# Patient Record
Sex: Male | Born: 1955 | Race: White | Hispanic: No | Marital: Single | Smoking: Never smoker
Health system: Southern US, Community
[De-identification: ages and names within clinical notes are randomized; demographics above are authoritative.]

## PROBLEM LIST (undated history)

## (undated) DIAGNOSIS — I10 Essential (primary) hypertension: Secondary | ICD-10-CM

## (undated) DIAGNOSIS — E785 Hyperlipidemia, unspecified: Secondary | ICD-10-CM

## (undated) DIAGNOSIS — G300 Alzheimer's disease with early onset: Secondary | ICD-10-CM

## (undated) DIAGNOSIS — E538 Deficiency of other specified B group vitamins: Secondary | ICD-10-CM

## (undated) DIAGNOSIS — F0280 Dementia in other diseases classified elsewhere without behavioral disturbance: Secondary | ICD-10-CM

---

## 2015-04-28 ENCOUNTER — Other Ambulatory Visit: Payer: Self-pay | Admitting: Internal Medicine

## 2015-04-28 DIAGNOSIS — F0391 Unspecified dementia with behavioral disturbance: Secondary | ICD-10-CM

## 2015-05-19 ENCOUNTER — Other Ambulatory Visit: Payer: Self-pay

## 2015-06-09 ENCOUNTER — Ambulatory Visit
Admission: RE | Admit: 2015-06-09 | Discharge: 2015-06-09 | Disposition: A | Discharge status: Home / Self Care | Payer: BLUE CROSS/BLUE SHIELD | Source: Ambulatory Visit | Attending: Internal Medicine | Admitting: Internal Medicine

## 2015-06-09 DIAGNOSIS — R413 Other amnesia: Secondary | ICD-10-CM | POA: Diagnosis not present

## 2015-06-09 DIAGNOSIS — F0391 Unspecified dementia with behavioral disturbance: Secondary | ICD-10-CM | POA: Insufficient documentation

## 2015-06-09 MED ORDER — GADOBENATE DIMEGLUMINE 529 MG/ML IV SOLN
20.0000 mL | Freq: Once | INTRAVENOUS | Status: AC | PRN
  Administered 2015-06-09: 18 mL via INTRAVENOUS

## 2017-02-20 ENCOUNTER — Emergency Department
Admission: EM | Admit: 2017-02-20 | Discharge: 2017-02-20 | Disposition: A | Discharge status: Home / Self Care | Payer: Medicare HMO | Attending: Emergency Medicine | Admitting: Emergency Medicine

## 2017-02-20 ENCOUNTER — Emergency Department: Payer: Medicare HMO

## 2017-02-20 DIAGNOSIS — I10 Essential (primary) hypertension: Secondary | ICD-10-CM | POA: Insufficient documentation

## 2017-02-20 DIAGNOSIS — F0281 Dementia in other diseases classified elsewhere with behavioral disturbance: Secondary | ICD-10-CM

## 2017-02-20 DIAGNOSIS — G300 Alzheimer's disease with early onset: Secondary | ICD-10-CM | POA: Diagnosis not present

## 2017-02-20 DIAGNOSIS — Z79899 Other long term (current) drug therapy: Secondary | ICD-10-CM | POA: Diagnosis not present

## 2017-02-20 DIAGNOSIS — F0390 Unspecified dementia without behavioral disturbance: Secondary | ICD-10-CM

## 2017-02-20 DIAGNOSIS — Z7982 Long term (current) use of aspirin: Secondary | ICD-10-CM | POA: Diagnosis not present

## 2017-02-20 DIAGNOSIS — F02818 Dementia in other diseases classified elsewhere, unspecified severity, with other behavioral disturbance: Secondary | ICD-10-CM

## 2017-02-20 LAB — CBC
HEMATOCRIT: 43.9 % (ref 40.0–52.0)
HEMOGLOBIN: 15.5 g/dL (ref 13.0–18.0)
MCH: 31.9 pg (ref 26.0–34.0)
MCHC: 35.3 g/dL (ref 32.0–36.0)
MCV: 90.3 fL (ref 80.0–100.0)
Platelets: 185 10*3/uL (ref 150–440)
RBC: 4.86 MIL/uL (ref 4.40–5.90)
RDW: 13.2 % (ref 11.5–14.5)
WBC: 7.8 10*3/uL (ref 3.8–10.6)

## 2017-02-20 LAB — COMPREHENSIVE METABOLIC PANEL
ALT: 18 U/L (ref 17–63)
AST: 26 U/L (ref 15–41)
Albumin: 4.8 g/dL (ref 3.5–5.0)
Alkaline Phosphatase: 56 U/L (ref 38–126)
Anion gap: 11 (ref 5–15)
BUN: 13 mg/dL (ref 6–20)
CO2: 24 mmol/L (ref 22–32)
Calcium: 9.3 mg/dL (ref 8.9–10.3)
Chloride: 104 mmol/L (ref 101–111)
Creatinine, Ser: 1.15 mg/dL (ref 0.61–1.24)
Glucose, Bld: 145 mg/dL — ABNORMAL HIGH (ref 65–99)
POTASSIUM: 4.3 mmol/L (ref 3.5–5.1)
Sodium: 139 mmol/L (ref 135–145)
Total Bilirubin: 1.1 mg/dL (ref 0.3–1.2)
Total Protein: 7.2 g/dL (ref 6.5–8.1)

## 2017-02-20 MED ORDER — HALOPERIDOL 0.5 MG PO TABS: 0.5000 mg | ORAL_TABLET | Freq: Three times a day (TID) | ORAL | 0 refills | Status: DC

## 2017-02-20 MED ORDER — HALOPERIDOL 0.5 MG PO TABS
0.5000 mg | ORAL_TABLET | Freq: Once | ORAL | Status: AC
  Administered 2017-02-20: 0.5 mg via ORAL
  Filled 2017-02-20: qty 1

## 2017-02-20 NOTE — ED Notes (Signed)
RN called [facility] to give report. RN reported that [employer] worker had given report. This RN asked if there were any further questions [facility] denied having questions and reported to send pt back via EMS.

## 2017-02-20 NOTE — ED Notes (Signed)
BEHAVIORAL HEALTH ROUNDING Patient sleeping: No. Patient alert and oriented:  Alert - oriented to self  Behavior appropriate: Yes.  ; If no, describe:  Nutrition and fluids offered: yes Toileting and hygiene offered: Yes  Sitter present: q15 minute observations and security monitoring Law enforcement present: Yes  ODS   ENVIRONMENTAL ASSESSMENT Potentially harmful objects out of patient reach: Yes.   Personal belongings secured: Yes.   Patient dressed in hospital provided attire only: Yes.   Plastic bags out of patient reach: Yes.   Patient care equipment (cords, cables, call bells, lines, and drains) shortened, removed, or accounted for: Yes.   Equipment and supplies removed from bottom of stretcher: Yes.   Potentially toxic materials out of patient reach: Yes.   Sharps container removed or out of patient reach: Yes.

## 2017-02-20 NOTE — ED Provider Notes (Signed)
-----------------------------------------   6:52 PM on 02/20/2017 -----------------------------------------   Blood pressure (!) 139/6, pulse 72, temperature 98.3 F (36.8 C), temperature source Oral, resp. rate 18, height  (1.727 m), weight 63.5 kg (140 lb), SpO2 96 %.  Patient has been evaluated by psychiatry and cleared for discharge. IVC lifted by Dr. Toni Amend. Patient's labs have been reviewed with no acute findings. Patient will be discharged at this time to [facility], [place], [place] 02/20/17 [phone]

## 2017-02-20 NOTE — ED Notes (Signed)
EMS has arrived to take pt home. Pt in stretcher and in NAD at this time.

## 2017-02-20 NOTE — Clinical Social Work Note (Addendum)
CSW received Social Work consult. CSW spoke with [place] at [facility] [phone] who stated pt has been a resident for 1 1/2 years and has not exhibited the physical and verbal aggressive behaviors that he has exhibited in the last 2 weeks. Burna Mortimer states pt was taken off of 0.5 mg of scheduled Haldol and switched to PRN Haldol and "this is when the behaviors began." [place] states facility is agreeable to accept pt back if on scheduled Haldol.   CSW updated Dr. Weber Cooks, who is agreeable to provide Rx. CSW updated Dr. Alfred Levins. CSW met with pt at bedside and pt agreeable and anxious to return back "home"  (facility). RN to call report to [place] at [phone]. ED Secretary to arrange transportation with EMS. CSW signing off as no further Social Work needs identified.   Oretha Ellis, Latanya Presser, [place] Social Worker-ED [phone]

## 2017-02-20 NOTE — ED Provider Notes (Signed)
[facility] Emergency Department Provider Note  ____________________________________________  Time seen: Approximately 3:02 PM  I have reviewed the triage vital signs and the nursing notes.   HISTORY  Chief Complaint Dementia  Level 5 Caveat: Portions of the History and Physical are unable to be obtained due to patient being a poor historian related to chronic dementia   HPI Miguel Tran is a 61 y.o. male sent to the ED from [facility] due to trying to walk out of the facility today. Patient has a history of dementia, on Aricept and Namenda, but is not currently in the memory care unit at [facility]. No falls or acute symptoms reported. Patient denies any complaints whatsoever.  patient denies chest pain abdominal pain headache vision changes or weakness or shortness of breath   No past medical history on file. dementia Hypertension  There are no active problems to display for this patient.    No past surgical history on file.   Prior to Admission medications   Medication Sig Start Date End Date Taking? Authorizing Provider  alum & mag hydroxide-simeth (MINTOX) 200-200-20 MG/5ML suspension Take 30 mLs by mouth as needed for indigestion or heartburn.   Yes [provider]  amLODipine (NORVASC) 10 MG tablet Take 10 mg by mouth daily.   Yes [provider]  aspirin EC 81 MG tablet Take 81 mg by mouth daily.   Yes [provider]  donepezil (ARICEPT) 10 MG tablet Take 10 mg by mouth at bedtime. 09/02/15  Yes [provider]  guaifenesin (ROBITUSSIN) 100 MG/5ML syrup Take 200 mg by mouth every 6 (six) hours as needed for cough.   Yes [provider]  haloperidol (HALDOL) 0.5 MG tablet Take 0.5 mg by mouth every 6 (six) hours as needed for agitation.   Yes [provider]  loperamide (IMODIUM) 2 MG capsule Take 2 mg by mouth as needed for diarrhea or loose stools.   Yes [provider]   LORazepam (ATIVAN) 1 MG tablet Take 1 mg by mouth 3 (three) times daily.   Yes [provider]  magnesium hydroxide (MILK OF MAGNESIA) 400 MG/5ML suspension Take 30 mLs by mouth at bedtime as needed for constipation.   Yes [provider]  memantine (NAMENDA) 5 MG tablet Take 5 mg by mouth 2 (two) times daily.   Yes [provider]  Neomycin-Bacitracin-Polymyxin (TRIPLE ANTIBIOTIC) [phone] OINT Apply 1 application topically as needed.   Yes [provider]  QUEtiapine (SEROQUEL) 100 MG tablet Take 100 mg by mouth at bedtime.   Yes [provider]  sertraline (ZOLOFT) 50 MG tablet Take 50 mg by mouth daily.   Yes [provider]  vitamin B-12 (CYANOCOBALAMIN) 1000 MCG tablet Take 1,000 mcg by mouth daily.   Yes [provider]     Allergies Patient has no known allergies.   No family history on file.  Social History Social History  Substance Use Topics  . Smoking status: Never Smoker  . Smokeless tobacco: Never Used  . Alcohol use No    Review of Systems unable to reliably obtain due to dementia  ____________________________________________   PHYSICAL EXAM:  VITAL SIGNS: ED Triage Vitals  Enc Vitals Group     BP 02/20/17 1409 (!) 139/6     Pulse Rate 02/20/17 1409 72     Resp 02/20/17 1409 18     Temp 02/20/17 1409 98.3 F (36.8 C)     Temp Source 02/20/17 1409 Oral  SpO2 02/20/17 1409 96 %     Weight 02/20/17 1335 140 lb (63.5 kg)     Height 02/20/17 1335  (1.727 m)     Head Circumference --      Peak Flow --      Pain Score --      Pain Loc --      Pain Edu? --      Excl. in GC? --     Vital signs reviewed, nursing assessments reviewed.   Constitutional:   Alert and oriented to self. Well appearing and in no distress. Eyes:   No scleral icterus.  EOMI. No nystagmus. No conjunctival pallor. PERRL. ENT   Head:   Normocephalic and atraumatic.   Nose:   No congestion/rhinnorhea.     Mouth/Throat:   MMM, no pharyngeal erythema. No peritonsillar mass.    Neck:   No meningismus. Full ROM Hematological/Lymphatic/Immunilogical:   No cervical lymphadenopathy. Cardiovascular:   RRR. Symmetric bilateral radial and DP pulses.  No murmurs.  Respiratory:   Normal respiratory effort without tachypnea/retractions. Breath sounds are clear and equal bilaterally. No wheezes/rales/rhonchi. Gastrointestinal:   Soft and nontender. Non distended. There is no CVA tenderness.  No rebound, rigidity, or guarding. Genitourinary:   deferred Musculoskeletal:   Normal range of motion in all extremities. No joint effusions.  No lower extremity tenderness.  No edema. Neurologic:   speech likely at baseline. Some difficulty expressing complete sentences and thoughts. Stuttering..  Motor grossly intact.ambulatory with steady gait No acute focal neurologic deficits are appreciated.  Skin:    Skin is warm, dry and intact. No rash noted.  No petechiae, purpura, or bullae.  ____________________________________________    LABS (pertinent positives/negatives) (all labs ordered are listed, but only abnormal results are displayed) Labs Reviewed  COMPREHENSIVE METABOLIC PANEL - Abnormal; Notable for the following:       Result Value   Glucose, Bld 145 (*)    All other components within normal limits  CBC  URINALYSIS, COMPLETE (UACMP) WITH MICROSCOPIC   ____________________________________________   EKG    ____________________________________________    RADIOLOGY  No results found.  ____________________________________________   PROCEDURES Procedures  ____________________________________________   INITIAL IMPRESSION / ASSESSMENT AND PLAN / ED COURSE  Pertinent labs & imaging results that were available during my care of the patient were reviewed by me and considered in my medical decision making (see chart for details).  patient well appearing no acute distress,  unremarkable vital signs. Sent to the ED for wandering behavior, likely part of his chronic dementia. Exam is otherwise benign and reassuring. Check labs, check CT head as I'm not able to get a clear picture of his baseline mental status from reviewing his outpatient primary care notes.  Requested TTS and psychiatry evaluation for further recommendations. I would expect the patient is suitable for discharge home, consideration of upgrading him to the memory care.      ____________________________________________   FINAL CLINICAL IMPRESSION(S) / ED DIAGNOSES  Final diagnoses:  Chronic dementia without behavioral disturbance      New Prescriptions   No medications on file     Portions of this note were generated with dragon dictation software. Dictation errors may occur despite best attempts at proofreading.    Sharman Cheek, MD 02/20/17 [phone]

## 2017-02-20 NOTE — BH Assessment (Signed)
Assessment Note  Miguel Tran is an 61 y.o. male presenting via EMS to [facility] for assessment. Information obtained from pt's residence [facility], [place],  [phone]), Pts father Miguel Tran [phone]), and pt interview. Pt has h/o dementia and is a poor historian. Pt is oriented to place only. Pt unable to provide current date and president. Pt responds to the name Miguel Tran and not Miguel Tran. Pt presents as increasingly irritable. Pt partially complies with clinician's request. Pt initially states he presents because "it's a prank". When asked for prank details, pt presents confused and denies any pranks. Pt denies SI and HI. Pt denies any type of altercations at home pta.  Per obtained collateral, pt has h/o dementia. Pt resides in [employer] assistant living unit and not on the memory care unit. Pt has become increasingly combative, agitated and verbally abusive toward staff. Pt also attempted to wander away from the assistant living on today. Staff reports onset of sxs to be two weeks ago. Pt's Haldol was was changed to PRN two weeks ago as well. Staff believe pt is in need of medication adjustment.  Miguel Tran contacted pt's father with behavioral concerns who in turn left a message with Miguel Tran (Miguel Tran). Miguel Tran recommended pt present to ED for evaluation.    Past Medical History: No past medical history on file.  No past surgical history on file.  Family History: No family history on file.  Social History:  reports that he has never smoked. He has never used smokeless tobacco. He reports that he does not drink alcohol. His drug history is not on file.  Additional Social History:     CIWA: CIWA-Ar BP: (!) 139/6 Pulse Rate: 72 COWS:    Allergies: No Known Allergies  Home Medications:  (Not in a hospital admission)  OB/GYN Status:  No LMP for male patient.  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) Can pt return to current living arrangement?: Yes Admission Status: Voluntary Is patient capable of signing voluntary admission?: No (altered mental status) Referral Source: [place] Insurance type: BCBS     Crisis Care Plan 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 :  (UTA due to altered mental status- pt poor historian) Suicidal Intent: No Has patient had any suicidal intent within the past 6 months prior to admission? : Other (comment) (UTA due to altered mental status- pt poor historian) Is patient at risk for suicide?: No Suicidal Plan?: No Has patient had any suicidal plan within the past 6 months prior to admission? :  (UTA due to altered mental status- pt poor historian) Access to Means: No What has been your use of drugs/alcohol within the last 12 months?: UTA due to altered mental status- pt poor historian Previous Attempts/Gestures:  (UTA due to altered mental status- pt poor historian) How many times?:  (UTA due to altered mental  status- pt poor historian) Other Self Harm Risks:  (UTA due to altered mental status- pt poor historian) Triggers for Past Attempts:  (UTA due to altered mental status- pt poor historian) Intentional Self Injurious Behavior:  (UTA due to altered mental status- pt poor historian) Family Suicide History:   (UTA due to altered mental status- pt poor historian) Recent stressful life event(s):  (UTA due to altered mental status- pt poor historian) Persecutory voices/beliefs?:  (UTA due to altered mental status- pt poor historian) Depression:  (UTA due to altered mental status- pt poor historian) Depression Symptoms:  (UTA due to altered mental status- pt poor historian) Substance abuse history and/or treatment for substance abuse?:  (UTA due to altered mental status- pt poor historian) Suicide prevention information given to non-admitted patients: Not applicable  Risk to Others within the past 6 months Homicidal Ideation: No Does patient have any lifetime risk of violence toward others beyond the six months prior to admission? :  (UTA due to altered mental status- pt poor historian) Thoughts of Harm to Others:  (UTA due to altered mental status- pt poor historian) Current Homicidal Intent:  (UTA due to altered mental status- pt poor historian) Current Homicidal Plan:  (UTA due to altered mental status- pt poor historian) Access to Homicidal Means:  (UTA due to altered mental status- pt poor historian) Identified Victim:  (UTA due to altered mental status- pt poor historian) History of harm to others?:  (UTA due to altered mental status- pt poor historian) Assessment of Violence: None Noted Does patient have access to weapons?:  (UTA due to altered mental status- pt poor historian) Criminal Charges Pending?:  (UTA due to altered mental status- pt poor historian) Does patient have a court date:  (UTA due to altered mental status- pt poor historian) Is patient on probation?:  (UTA due to altered mental status- pt poor historian)  Psychosis Hallucinations:  (UTA due to altered mental status- pt poor historian) Delusions:  (UTA due to altered mental status- pt poor historian)  Mental Status Report Appearance/Hygiene: Unremarkable Eye Contact: Fair Motor Activity: Freedom of movement Speech:  Aggressive Level of Consciousness: Irritable Mood: Irritable Affect: Other (Comment) (Mood Congruent) Anxiety Level: Moderate Thought Processes: Irrelevant Judgement: Impaired Orientation: Place Obsessive Compulsive Thoughts/Behaviors: None     ADLScreening [facility]) Patient's cognitive ability adequate to safely complete daily activities?: No Patient able to express need for assistance with ADLs?: Yes Independently performs ADLs?: Yes (appropriate for developmental age)  Prior Inpatient Therapy Prior Inpatient Therapy:  (UTA due to altered mental status- pt poor historian)  Prior Outpatient Therapy Prior Outpatient Therapy:  (UTA due to altered mental status- pt poor historian) Does patient have an ACCT team?: No Does patient have Intensive In-Tran Services?  : No Does patient have [facility]arch services? : No Does patient have P4CC services?: No  ADL Screening (condition at time of admission) Patient's cognitive ability adequate to safely complete daily activities?: No Is the patient deaf or have difficulty hearing?: No Does the patient have difficulty seeing, even when wearing glasses/contacts?: No Does the patient have difficulty concentrating, remembering, or making decisions?: Yes Patient able to express need for assistance with ADLs?: Yes Does the patient have difficulty dressing or bathing?: No Independently performs ADLs?: Yes (appropriate for developmental age) Does the patient have difficulty walking or climbing stairs?: No Weakness of Legs: None Weakness of Arms/Hands: None  Home Assistive Devices/Equipment Home Assistive Devices/Equipment: None  Therapy Consults (therapy consults require a physician order) PT Evaluation  Needed: No OT Evalulation Needed: No SLP Evaluation Needed: No Abuse/Neglect Assessment (Assessment to be complete while patient is alone) Physical Abuse:  (UTA due to altered mental status- pt poor historian) Verbal Abuse:  (UTA  due to altered mental status- pt poor historian) Sexual Abuse:  (UTA due to altered mental status- pt poor historian) Exploitation of patient/patient's resources:  (UTA due to altered mental status- pt poor historian) Self-Neglect:  (UTA due to altered mental status- pt poor historian) Values / Beliefs Cultural Requests During Hospitalization: None Spiritual Requests During Hospitalization: None Consults Spiritual Care Consult Needed: No Social Work Consult Needed: No Merchant navy officer (For Healthcare) Does Patient Have a Medical Advance Directive?: Yes Type of Advance Directive: Midwife Copy of Healthcare Power of Attorney in Chart?: No - copy requested (Clinician contacted pt's father Devarious Pavek [phone]) and requested copy be provided to hospital.) Would patient like information on creating a medical advance directive?: No - Patient declined    Additional Information 1:1 In Past 12 Months?: No CIRT Risk: No Elopement Risk: Yes Does patient have medical clearance?: No     Disposition:  Disposition Initial Assessment Completed for this Encounter: Yes Disposition of Patient: Other dispositions Other disposition(s): Other (Comment) (Pt recommended for d/c per Dr.Clapacs)  On Site Evaluation by:   Reviewed with Physician:    Elbie Statzer J [place] 02/20/2017 5:49 PM

## 2017-02-20 NOTE — ED Notes (Signed)
BEHAVIORAL HEALTH ROUNDING Patient sleeping: No. Patient alert : yes Behavior appropriate: Yes.  ; If no, describe:  Nutrition and fluids offered: yes Toileting and hygiene offered: Yes  Sitter present: q15 minute observations and security monitoring Law enforcement present: Yes  ODS  

## 2017-02-20 NOTE — ED Triage Notes (Signed)
He arrives today via [facility] from [facility] called EMS due to pt has been trying to walk away from the facility today  History of dementia

## 2017-02-20 NOTE — Consult Note (Signed)
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 male patient admitted with "sir I do not understand".  HPI:  Patient interviewed chart reviewed. Case reviewed with TTS and emergency room physician. 61 year old man has a history of early onset Alzheimer's disease which has been progressing for the past 2-3 years but has rapidly escalated. He was brought here from [place] with reports that he was trying to wander away from the facility. I don't have a lot of other detail. The patient tells me that he lives at "some kind of facility" and that he was going to visit his father. He is not able to tell a more clear story than that. He does not know where he is currently and does not understand his present situation. He has not been violent here but has been requiring a lot of redirection to keep him from wandering out of his room. Patient denies any physical symptoms. Denies feeling sick denies any pain. Denies feeling depressed. Denies suicidal or homicidal ideation. Does not appear to be hallucinating. Medications reviewed he is currently on low dose of Zoloft and Aricept and a fairly modest dose of Seroquel at night with Haldol being used when necessary. I am told by the TTS assessor that the people at [facility] say that he has gotten worse since his medicine of Haldol was changed from standing to when necessary.  Social history: Patient's  father is his power of attorney. Patient is living at [facility]. He is not currently married. Sounds like children are not really involved. He is a retired Hotel manager.  Medical history: No history of stroke or heart attack. Seems to be in relatively good health overall other than his dementia.  Substance abuse history: Nothing in the chart about any history of alcohol or drug abuse and the patient denies ever having had a problem with it  Past Psychiatric History: No psychiatric hospitalization no history of violence no history of suicide attempts. Currently on some psychiatric medicine to assist with behavior problems from his dementia. Patient has had a full workup for his dementia without any reversible cause being found and has been assumed to have Alzheimer's type dementia.  Risk to Self: Suicidal Ideation: No Suicidal Intent: No Is patient at risk for suicide?: No Suicidal Plan?: No Access to Means: No What has been your use of drugs/alcohol within the last 12 months?: UTA due to altered mental status- pt poor historian How many times?:  (UTA due to altered mental status- pt poor historian) Other Self Harm Risks:  (UTA due to altered mental status- pt poor historian) Triggers for Past Attempts:  (UTA due to altered mental status- pt poor historian) Intentional Self Injurious Behavior:  (UTA due to altered mental status- pt poor historian) Risk to Others: Homicidal Ideation: No Thoughts of Harm to Others:  (UTA due to altered mental status- pt poor historian) Current Homicidal Intent:  (UTA due to altered mental status- pt poor historian) Current Homicidal  Plan:  (UTA due to altered mental status- pt poor historian) Access to Homicidal Means:  (UTA due to altered mental status- pt poor historian) Identified Victim:  (UTA due to altered mental status- pt poor historian) History of harm to others?:  (UTA due to altered mental status- pt poor historian) Assessment of Violence: None  Noted Does patient have access to weapons?:  (UTA due to altered mental status- pt poor historian) Criminal Charges Pending?:  (UTA due to altered mental status- pt poor historian) Does patient have a court date:  (UTA due to altered mental status- pt poor historian) Prior Inpatient Therapy:   Prior Outpatient Therapy:    Past Medical History: No past medical history on file. No past surgical history on file. Family History: No family history on file. Family Psychiatric  History: Reportedly there was a grandfather who also had very early onset dementia Social History:  History  Alcohol Use No     History  Drug use: Unknown    Social History   Social History  . Marital status: Single    Spouse name: N/A  . Number of children: N/A  . Years of education: N/A   Social History Main Topics  . Smoking status: Never Smoker  . Smokeless tobacco: Never Used  . Alcohol use No  . Drug use: Unknown  . Sexual activity: Not on file   Other Topics Concern  . Not on file   Social History Narrative  . No narrative on file   Additional Social History:    Allergies:  No Known Allergies  Labs:  Results for orders placed or performed during the hospital encounter of 02/20/17 (from the past 48 hour(s))  Comprehensive metabolic panel     Status: Abnormal   Collection Time: 02/20/17  1:41 PM  Result Value Ref Range   Sodium 139 135 - 145 mmol/L   Potassium 4.3 3.5 - 5.1 mmol/L   Chloride 104 101 - 111 mmol/L   CO2 24 22 - 32 mmol/L   Glucose, Bld 145 (H) 65 - 99 mg/dL   BUN 13 6 - 20 mg/dL   Creatinine, Ser 1.15 0.61 - 1.24 mg/dL   Calcium 9.3 8.9 - 10.3 mg/dL   Total Protein 7.2 6.5 - 8.1 g/dL   Albumin 4.8 3.5 - 5.0 g/dL   AST 26 15 - 41 U/L   ALT 18 17 - 63 U/L   Alkaline Phosphatase 56 38 - 126 U/L   Total Bilirubin 1.1 0.3 - 1.2 mg/dL   GFR calc non Af Amer >60 >60 mL/min   GFR calc Af Amer >60 >60 mL/min    Comment: (NOTE) The eGFR has been calculated using the CKD EPI  equation. This calculation has not been validated in all clinical situations. eGFR's persistently <60 mL/min signify possible Chronic Kidney Disease.    Anion gap 11 5 - 15  CBC     Status: None   Collection Time: 02/20/17  1:41 PM  Result Value Ref Range   WBC 7.8 3.8 - 10.6 K/uL   RBC 4.86 4.40 - 5.90 MIL/uL   Hemoglobin 15.5 13.0 - 18.0 g/dL   HCT 43.9 40.0 - 52.0 %   MCV 90.3 80.0 - 100.0 fL   MCH 31.9 26.0 - 34.0 pg   MCHC 35.3 32.0 - 36.0 g/dL   RDW 13.2 11.5 - 14.5 %   Platelets 185 150 - 440 K/uL    Comment: PLATELET CLUMPS NOTED ON SMEAR, COUNT APPEARS ADEQUATE  Current Facility-Administered Medications  Medication Dose Route Frequency Provider Last Rate Last Dose  . haloperidol (HALDOL) tablet 0.5 mg  0.5 mg Oral Once Carrie Mew, MD       Current Outpatient Prescriptions  Medication Sig Dispense Refill  . alum & mag hydroxide-simeth (MINTOX) 200-200-20 MG/5ML suspension Take 30 mLs by mouth as needed for indigestion or heartburn.    [name] amLODipine (NORVASC) 10 MG tablet Take 10 mg by mouth daily.    [name] aspirin EC 81 MG tablet Take 81 mg by mouth daily.    [name] donepezil (ARICEPT) 10 MG tablet Take 10 mg by mouth at bedtime.    [name] guaifenesin (ROBITUSSIN) 100 MG/5ML syrup Take 200 mg by mouth every 6 (six) hours as needed for cough.    . haloperidol (HALDOL) 0.5 MG tablet Take 0.5 mg by mouth every 6 (six) hours as needed for agitation.    [name] loperamide (IMODIUM) 2 MG capsule Take 2 mg by mouth as needed for diarrhea or loose stools.    [name] LORazepam (ATIVAN) 1 MG tablet Take 1 mg by mouth 3 (three) times daily.    . magnesium hydroxide (MILK OF MAGNESIA) 400 MG/5ML suspension Take 30 mLs by mouth at bedtime as needed for constipation.    . memantine (NAMENDA) 5 MG tablet Take 5 mg by mouth 2 (two) times daily.    [name] Neomycin-Bacitracin-Polymyxin (TRIPLE ANTIBIOTIC) [phone] OINT Apply 1 application topically as needed.    [name] QUEtiapine (SEROQUEL) 100 MG tablet Take  100 mg by mouth at bedtime.    . sertraline (ZOLOFT) 50 MG tablet Take 50 mg by mouth daily.    . vitamin B-12 (CYANOCOBALAMIN) 1000 MCG tablet Take 1,000 mcg by mouth daily.      Musculoskeletal: Strength & Muscle Tone: within normal limits Gait & Station: normal Patient leans: N/A  Psychiatric Specialty Exam: Physical Exam  Nursing note and vitals reviewed. Constitutional: He appears well-developed and well-nourished.  HENT:  Head: Normocephalic and atraumatic.  Eyes: Pupils are equal, round, and reactive to light. Conjunctivae are normal.  Neck: Normal range of motion.  Cardiovascular: Regular rhythm and normal heart sounds.   Respiratory: Effort normal. No respiratory distress.  GI: Soft.  Musculoskeletal: Normal range of motion.  Neurological: He is alert.  Skin: Skin is warm and dry.  Psychiatric: His affect is blunt. His speech is delayed. He is agitated. He is not aggressive. Thought content is not paranoid and not delusional. Cognition and memory are impaired. He expresses impulsivity and inappropriate judgment. He expresses no homicidal and no suicidal ideation. He is noncommunicative. He exhibits abnormal recent memory and abnormal remote memory.    Review of Systems  Constitutional: Negative.   HENT: Negative.   Eyes: Negative.   Respiratory: Negative.   Cardiovascular: Negative.   Gastrointestinal: Negative.   Musculoskeletal: Negative.   Skin: Negative.   Neurological: Negative.   Psychiatric/Behavioral: Negative.     Blood pressure (!) 139/6, pulse 72, temperature 98.3 F (36.8 C), temperature source Oral, resp. rate 18, height 5' 8"  (1.727 m), weight 63.5 kg (140 lb), SpO2 96 %.Body mass index is 21.29 kg/m.  General Appearance: Disheveled  Eye Contact:  Minimal  Speech:  Garbled and Slow  Volume:  Decreased  Mood:  Euthymic  Affect:  Mostly appears confused  Thought Process:  Disorganized  Orientation:  Negative  Thought Content:  Illogical and  Rumination  Suicidal Thoughts:  No  Homicidal Thoughts:  No  Memory:  Immediate;   Poor  Recent;   Poor Remote;   Fair  Judgement:  Impaired  Insight:  Lacking  Psychomotor Activity:  Shuffling Gait  Concentration:  Concentration: Poor  Recall:  Poor  Fund of Knowledge:  Poor  Language:  Poor  Akathisia:  No  Handed:  Right  AIMS (if indicated):     Assets:  Housing Physical Health Social Support  ADL's:  Impaired  Cognition:  Impaired,  Moderate and Severe  Sleep:        Treatment Plan Summary: Daily contact with patient to assess and evaluate symptoms and progress in treatment, Medication management and Plan 61 year old man who has Alzheimer's type dementia and was wandering away from the facility where he lives. Wandering behavior is pretty typical of dementia. Patient does not meet commitment criteria and does not need inpatient level hospitalization. Patient should be discharged back to his living environment where if he requires a more advanced level of containment that should be arranged. I would be glad to provide a prescription for 0.5 mg Haldol which can be given on a twice a day basis if they think that would be more helpful for him right now. He will continue to follow-up with Dr. Doy Hutching as an outpatient. Urgency room physician and TTS.  Disposition: No evidence of imminent risk to self or others at present.   Patient does not meet criteria for psychiatric inpatient admission.  Alethia Berthold, MD 02/20/2017 5:10 PM

## 2017-03-20 ENCOUNTER — Other Ambulatory Visit: Payer: Self-pay | Admitting: Psychiatry

## 2017-04-11 ENCOUNTER — Observation Stay (HOSPITAL_BASED_OUTPATIENT_CLINIC_OR_DEPARTMENT_OTHER): Payer: Medicare HMO

## 2017-04-11 ENCOUNTER — Observation Stay: Payer: Medicare HMO

## 2017-04-11 ENCOUNTER — Other Ambulatory Visit: Payer: Self-pay

## 2017-04-11 ENCOUNTER — Encounter: Payer: Self-pay | Admitting: Internal Medicine

## 2017-04-11 ENCOUNTER — Emergency Department: Payer: Medicare HMO

## 2017-04-11 ENCOUNTER — Observation Stay
Admit: 2017-04-11 | Discharge: 2017-04-11 | Disposition: A | Discharge status: Home / Self Care | Payer: Medicare HMO | Attending: Internal Medicine | Admitting: Internal Medicine

## 2017-04-11 ENCOUNTER — Observation Stay
Admission: EM | Admit: 2017-04-11 | Discharge: 2017-04-14 | Disposition: A | Discharge status: Home / Self Care | Payer: Medicare HMO | Attending: Internal Medicine | Admitting: Internal Medicine

## 2017-04-11 DIAGNOSIS — G300 Alzheimer's disease with early onset: Secondary | ICD-10-CM | POA: Diagnosis present

## 2017-04-11 DIAGNOSIS — R410 Disorientation, unspecified: Secondary | ICD-10-CM | POA: Diagnosis not present

## 2017-04-11 DIAGNOSIS — R4182 Altered mental status, unspecified: Secondary | ICD-10-CM

## 2017-04-11 DIAGNOSIS — E538 Deficiency of other specified B group vitamins: Secondary | ICD-10-CM | POA: Insufficient documentation

## 2017-04-11 DIAGNOSIS — E785 Hyperlipidemia, unspecified: Secondary | ICD-10-CM | POA: Insufficient documentation

## 2017-04-11 DIAGNOSIS — R531 Weakness: Secondary | ICD-10-CM | POA: Insufficient documentation

## 2017-04-11 DIAGNOSIS — Z8249 Family history of ischemic heart disease and other diseases of the circulatory system: Secondary | ICD-10-CM | POA: Diagnosis not present

## 2017-04-11 DIAGNOSIS — Z7982 Long term (current) use of aspirin: Secondary | ICD-10-CM | POA: Diagnosis not present

## 2017-04-11 DIAGNOSIS — R32 Unspecified urinary incontinence: Secondary | ICD-10-CM | POA: Diagnosis not present

## 2017-04-11 DIAGNOSIS — Z801 Family history of malignant neoplasm of trachea, bronchus and lung: Secondary | ICD-10-CM | POA: Insufficient documentation

## 2017-04-11 DIAGNOSIS — F02818 Dementia in other diseases classified elsewhere, unspecified severity, with other behavioral disturbance: Secondary | ICD-10-CM | POA: Diagnosis present

## 2017-04-11 DIAGNOSIS — I639 Cerebral infarction, unspecified: Secondary | ICD-10-CM

## 2017-04-11 DIAGNOSIS — F0281 Dementia in other diseases classified elsewhere with behavioral disturbance: Secondary | ICD-10-CM | POA: Diagnosis not present

## 2017-04-11 DIAGNOSIS — Z79899 Other long term (current) drug therapy: Secondary | ICD-10-CM | POA: Diagnosis not present

## 2017-04-11 DIAGNOSIS — R5383 Other fatigue: Secondary | ICD-10-CM | POA: Diagnosis present

## 2017-04-11 DIAGNOSIS — R2681 Unsteadiness on feet: Secondary | ICD-10-CM | POA: Insufficient documentation

## 2017-04-11 DIAGNOSIS — I10 Essential (primary) hypertension: Secondary | ICD-10-CM | POA: Insufficient documentation

## 2017-04-11 HISTORY — DX: Deficiency of other specified B group vitamins: E53.8

## 2017-04-11 HISTORY — DX: Alzheimer's disease with early onset: G30.0

## 2017-04-11 HISTORY — DX: Dementia in other diseases classified elsewhere, unspecified severity, without behavioral disturbance, psychotic disturbance, mood disturbance, and anxiety: F02.80

## 2017-04-11 HISTORY — DX: Essential (primary) hypertension: I10

## 2017-04-11 HISTORY — DX: Hyperlipidemia, unspecified: E78.5

## 2017-04-11 LAB — URINALYSIS, ROUTINE W REFLEX MICROSCOPIC
Bilirubin Urine: NEGATIVE
Glucose, UA: 50 mg/dL — AB
Hgb urine dipstick: NEGATIVE
Ketones, ur: NEGATIVE mg/dL
LEUKOCYTES UA: NEGATIVE
NITRITE: NEGATIVE
PROTEIN: NEGATIVE mg/dL
Specific Gravity, Urine: 1.016 (ref 1.005–1.030)
pH: 6 (ref 5.0–8.0)

## 2017-04-11 LAB — PROTIME-INR
INR: 1.08
Prothrombin Time: 13.9 seconds (ref 11.4–15.2)

## 2017-04-11 LAB — DIFFERENTIAL
BASOS ABS: 0 10*3/uL (ref 0–0.1)
BASOS PCT: 0 %
EOS ABS: 0 10*3/uL (ref 0–0.7)
EOS PCT: 0 %
LYMPHS ABS: 1.4 10*3/uL (ref 1.0–3.6)
Lymphocytes Relative: 14 %
Monocytes Absolute: 1 10*3/uL (ref 0.2–1.0)
Monocytes Relative: 10 %
NEUTROS PCT: 76 %
Neutro Abs: 7.8 10*3/uL — ABNORMAL HIGH (ref 1.4–6.5)

## 2017-04-11 LAB — COMPREHENSIVE METABOLIC PANEL
ALT: 16 U/L — ABNORMAL LOW (ref 17–63)
ANION GAP: 9 (ref 5–15)
AST: 19 U/L (ref 15–41)
Albumin: 4.6 g/dL (ref 3.5–5.0)
Alkaline Phosphatase: 57 U/L (ref 38–126)
BILIRUBIN TOTAL: 1.6 mg/dL — AB (ref 0.3–1.2)
BUN: 17 mg/dL (ref 6–20)
CHLORIDE: 105 mmol/L (ref 101–111)
CO2: 24 mmol/L (ref 22–32)
Calcium: 9.5 mg/dL (ref 8.9–10.3)
Creatinine, Ser: 1.17 mg/dL (ref 0.61–1.24)
GFR calc Af Amer: >60 mL/min (ref >60)
GFR calc non Af Amer: >60 mL/min (ref >60)
GLUCOSE: 115 mg/dL — AB (ref 65–99)
POTASSIUM: 3.7 mmol/L (ref 3.5–5.1)
Sodium: 138 mmol/L (ref 135–145)
TOTAL PROTEIN: 7.5 g/dL (ref 6.5–8.1)

## 2017-04-11 LAB — CBC
HCT: 45.8 % (ref 40.0–52.0)
HEMOGLOBIN: 15.7 g/dL (ref 13.0–18.0)
MCH: 31.4 pg (ref 26.0–34.0)
MCHC: 34.2 g/dL (ref 32.0–36.0)
MCV: 91.8 fL (ref 80.0–100.0)
Platelets: 192 10*3/uL (ref 150–440)
RBC: 4.99 MIL/uL (ref 4.40–5.90)
RDW: 13.6 % (ref 11.5–14.5)
WBC: 10.2 10*3/uL (ref 3.8–10.6)

## 2017-04-11 LAB — TROPONIN I: Troponin I: <0.03 ng/mL (ref <0.03)

## 2017-04-11 LAB — GLUCOSE, CAPILLARY: GLUCOSE-CAPILLARY: 95 mg/dL (ref 65–99)

## 2017-04-11 LAB — APTT: APTT: 35 s (ref 24–36)

## 2017-04-11 LAB — HEMOGLOBIN A1C
Hgb A1c MFr Bld: 5.4 % (ref 4.8–5.6)
Mean Plasma Glucose: 108.28 mg/dL

## 2017-04-11 LAB — TSH: TSH: 1.569 u[IU]/mL (ref 0.350–4.500)

## 2017-04-11 MED ORDER — LORAZEPAM 2 MG/ML IJ SOLN
1.0000 mg | INTRAMUSCULAR | Status: DC | PRN
  Administered 2017-04-12: 1 mg via INTRAVENOUS

## 2017-04-11 MED ORDER — SERTRALINE HCL 50 MG PO TABS
50.0000 mg | ORAL_TABLET | Freq: Every day | ORAL | Status: DC
  Administered 2017-04-12 – 2017-04-14 (×3): 50 mg via ORAL
  Filled 2017-04-11 (×4): qty 1

## 2017-04-11 MED ORDER — SODIUM CHLORIDE 0.9 % IV SOLN
INTRAVENOUS | Status: DC
  Administered 2017-04-11: 09:00:00 via INTRAVENOUS

## 2017-04-11 MED ORDER — STROKE: EARLY STAGES OF RECOVERY BOOK
Freq: Once | Status: AC
  Administered 2017-04-11: 19:00:00

## 2017-04-11 MED ORDER — HALOPERIDOL LACTATE 5 MG/ML IJ SOLN
5.0000 mg | Freq: Once | INTRAMUSCULAR | Status: AC
  Administered 2017-04-11: 5 mg via INTRAVENOUS

## 2017-04-11 MED ORDER — LORAZEPAM 2 MG/ML IJ SOLN
1.0000 mg | Freq: Once | INTRAMUSCULAR | Status: AC
  Administered 2017-04-11: 1 mg via INTRAVENOUS

## 2017-04-11 MED ORDER — ONDANSETRON HCL 4 MG PO TABS: 4.0000 mg | ORAL_TABLET | Freq: Four times a day (QID) | ORAL | Status: DC | PRN

## 2017-04-11 MED ORDER — MAGNESIUM HYDROXIDE 400 MG/5ML PO SUSP
30.0000 mL | Freq: Every evening | ORAL | Status: DC | PRN
  Filled 2017-04-11: qty 30

## 2017-04-11 MED ORDER — ONDANSETRON HCL 4 MG/2ML IJ SOLN: 4.0000 mg | Freq: Four times a day (QID) | INTRAMUSCULAR | Status: DC | PRN

## 2017-04-11 MED ORDER — HALOPERIDOL LACTATE 5 MG/ML IJ SOLN
2.0000 mg | Freq: Once | INTRAMUSCULAR | Status: DC
  Filled 2017-04-11: qty 1

## 2017-04-11 MED ORDER — LOPERAMIDE HCL 2 MG PO CAPS: 2.0000 mg | ORAL_CAPSULE | ORAL | Status: DC | PRN

## 2017-04-11 MED ORDER — HALOPERIDOL LACTATE 2 MG/ML PO CONC
0.5000 mg | Freq: Two times a day (BID) | ORAL | Status: DC
  Administered 2017-04-11 – 2017-04-14 (×6): 0.5 mg via ORAL
  Filled 2017-04-11 (×8): qty 0.3

## 2017-04-11 MED ORDER — LORAZEPAM 2 MG/ML IJ SOLN
INTRAMUSCULAR | Status: AC
  Administered 2017-04-12: 1 mg via INTRAVENOUS
  Filled 2017-04-11: qty 1

## 2017-04-11 MED ORDER — DOCUSATE SODIUM 100 MG PO CAPS
100.0000 mg | ORAL_CAPSULE | Freq: Two times a day (BID) | ORAL | Status: DC
  Administered 2017-04-13 – 2017-04-14 (×3): 100 mg via ORAL
  Filled 2017-04-11 (×3): qty 1

## 2017-04-11 MED ORDER — LORAZEPAM 2 MG/ML IJ SOLN
1.0000 mg | Freq: Once | INTRAMUSCULAR | Status: AC
  Administered 2017-04-11: 1 mg via INTRAVENOUS
  Filled 2017-04-11: qty 1

## 2017-04-11 MED ORDER — ALUM & MAG HYDROXIDE-SIMETH 200-200-20 MG/5ML PO SUSP: 30.0000 mL | ORAL | Status: DC | PRN

## 2017-04-11 MED ORDER — GUAIFENESIN 100 MG/5ML PO SYRP
200.0000 mg | ORAL_SOLUTION | Freq: Four times a day (QID) | ORAL | Status: DC | PRN
  Filled 2017-04-11: qty 10

## 2017-04-11 MED ORDER — MEMANTINE HCL 5 MG PO TABS
5.0000 mg | ORAL_TABLET | Freq: Two times a day (BID) | ORAL | Status: DC
  Administered 2017-04-12 – 2017-04-14 (×5): 5 mg via ORAL
  Filled 2017-04-11 (×6): qty 1

## 2017-04-11 MED ORDER — DONEPEZIL HCL 5 MG PO TABS
10.0000 mg | ORAL_TABLET | Freq: Every day | ORAL | Status: DC
  Administered 2017-04-12 – 2017-04-13 (×2): 10 mg via ORAL
  Filled 2017-04-11: qty 2
  Filled 2017-04-11: qty 1
  Filled 2017-04-11 (×2): qty 2

## 2017-04-11 MED ORDER — LORAZEPAM 1 MG PO TABS
1.0000 mg | ORAL_TABLET | Freq: Three times a day (TID) | ORAL | Status: DC
  Administered 2017-04-12: 13:00:00 1 mg via ORAL
  Filled 2017-04-11 (×2): qty 1

## 2017-04-11 MED ORDER — ASPIRIN EC 81 MG PO TBEC
81.0000 mg | DELAYED_RELEASE_TABLET | Freq: Every day | ORAL | Status: DC
  Administered 2017-04-12 – 2017-04-14 (×3): 81 mg via ORAL
  Filled 2017-04-11 (×3): qty 1

## 2017-04-11 MED ORDER — HALOPERIDOL LACTATE 5 MG/ML IJ SOLN
2.0000 mg | Freq: Once | INTRAMUSCULAR | Status: AC
  Administered 2017-04-11: 22:00:00 2 mg via INTRAVENOUS
  Filled 2017-04-11: qty 1

## 2017-04-11 MED ORDER — QUETIAPINE FUMARATE 25 MG PO TABS
100.0000 mg | ORAL_TABLET | Freq: Every day | ORAL | Status: DC
  Administered 2017-04-12 – 2017-04-13 (×2): 100 mg via ORAL
  Filled 2017-04-11 (×3): qty 4

## 2017-04-11 MED ORDER — ACETAMINOPHEN 650 MG RE SUPP: 650.0000 mg | Freq: Four times a day (QID) | RECTAL | Status: DC | PRN

## 2017-04-11 MED ORDER — ENOXAPARIN SODIUM 40 MG/0.4ML ~~LOC~~ SOLN
40.0000 mg | SUBCUTANEOUS | Status: DC
  Administered 2017-04-11 – 2017-04-14 (×4): 40 mg via SUBCUTANEOUS
  Filled 2017-04-11 (×4): qty 0.4

## 2017-04-11 MED ORDER — ACETAMINOPHEN 325 MG PO TABS
650.0000 mg | ORAL_TABLET | Freq: Four times a day (QID) | ORAL | Status: DC | PRN
  Administered 2017-04-12: 22:00:00 650 mg via ORAL
  Filled 2017-04-11: qty 2

## 2017-04-11 MED ORDER — LORAZEPAM 2 MG/ML IJ SOLN
INTRAMUSCULAR | Status: AC
  Administered 2017-04-11: 1 mg via INTRAVENOUS
  Filled 2017-04-11: qty 1

## 2017-04-11 MED ORDER — VITAMIN B-12 1000 MCG PO TABS
1000.0000 ug | ORAL_TABLET | Freq: Every day | ORAL | Status: DC
  Administered 2017-04-12 – 2017-04-14 (×3): 1000 ug via ORAL
  Filled 2017-04-11 (×4): qty 1

## 2017-04-11 MED ORDER — AMLODIPINE BESYLATE 10 MG PO TABS
10.0000 mg | ORAL_TABLET | Freq: Every day | ORAL | Status: DC
Start: 2017-04-11 — End: 2017-04-14
  Administered 2017-04-12 – 2017-04-14 (×3): 10 mg via ORAL
  Filled 2017-04-11 (×3): qty 1

## 2017-04-11 NOTE — ED Notes (Signed)
Pt taken to EEG from [facility].

## 2017-04-11 NOTE — ED Provider Notes (Addendum)
[facility] Emergency Department Provider Note   First MD Initiated Contact with Patient 04/11/17 [phone]     (approximate)  I have reviewed the triage vital signs and the nursing notes.  Level V caveat: All sinus dementia and nonverbal HISTORY  Chief Complaint Hypertension    HPI Miguel Tran is a 61 y.o. male with history of Alzheimer's dementia early-onset presents to the emergency department from [facility] with hypertension and "leaning to the right side with ambulation". Patient's father present and states that son is having difficulty walking tonight and that at baseline that he is able to walk without difficulty. In addition the patient's father states that he was notified by [facility] that the patient's blood pressure is 152/112. Patient's father is also concerned that his son may need a higher level of care than assisted living at [facility]. Patient's father states that his son is no longer able to feed himself. Of note patient's blood pressure on arrival 132/80   No past medical history on file.  Patient Active Problem List   Diagnosis Date Noted  . 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  Prior to Admission medications   Medication Sig Start Date End Date Taking? Authorizing Provider  alum & mag hydroxide-simeth (MINTOX) 200-200-20 MG/5ML suspension Take 30 mLs by mouth as needed for indigestion or heartburn.   Yes [provider]  amLODipine (NORVASC) 10 MG tablet Take 10 mg by mouth daily.   Yes [provider]  aspirin EC 81 MG tablet Take 81 mg by mouth daily.   Yes [provider]  donepezil (ARICEPT) 10 MG tablet Take 10 mg by mouth at bedtime. 09/02/15  Yes [provider]  guaifenesin (ROBITUSSIN) 100 MG/5ML syrup Take 200 mg by mouth every 6 (six) hours as needed for cough.   Yes [provider]    haloperidol (HALDOL) 2 MG/ML solution Take 0.5 mg 2 (two) times daily by mouth.   Yes [provider]  loperamide (IMODIUM) 2 MG capsule Take 2 mg by mouth as needed for diarrhea or loose stools.   Yes [provider]  LORazepam (ATIVAN) 1 MG tablet Take 1 mg by mouth 3 (three) times daily.   Yes [provider]  magnesium hydroxide (MILK OF MAGNESIA) 400 MG/5ML suspension Take 30 mLs by mouth at bedtime as needed for constipation.   Yes [provider]  memantine (NAMENDA) 5 MG tablet Take 5 mg by mouth 2 (two) times daily.   Yes [provider]  QUEtiapine (SEROQUEL) 100 MG tablet Take 100 mg by mouth at bedtime.   Yes [provider]  sertraline (ZOLOFT) 50 MG tablet Take 50 mg by mouth daily.   Yes [provider]  vitamin B-12 (CYANOCOBALAMIN) 1000 MCG tablet Take 1,000 mcg by mouth daily.   Yes [provider]  haloperidol (HALDOL) 0.5 MG tablet Take 1 tablet (0.5 mg total) by mouth 3 (three) times daily. Patient not taking: Reported on 04/11/2017 02/20/17 02/20/18  Clapacs, Jackquline DenmarkJohn T, MD    Allergies No known drug allergies No family history on file.  Social History Social History   Tobacco Use  . Smoking status: Never Smoker  . Smokeless tobacco: Never Used  Substance Use Topics  . Alcohol use: No  . Drug use: Not on file    Review of Systems Constitutional: No fever/chills Eyes: No visual changes. ENT:  No sore throat. Cardiovascular: Denies chest pain. Respiratory: Denies shortness of breath. Gastrointestinal: No abdominal pain.  No nausea, no vomiting.  No diarrhea.  No constipation. Genitourinary: Negative for dysuria. Musculoskeletal: Negative for neck pain.  Negative for back pain. Integumentary: Negative for rash. Neurological: Negative for headaches, focal weakness or numbness.   ____________________________________________   PHYSICAL EXAM:  VITAL SIGNS: ED Triage Vitals [04/11/17 0215]   Enc Vitals Group     BP 132/80     Pulse Rate 87     Resp 18     Temp 98.2 F (36.8 C)     Temp Source Oral     SpO2 95 %     Weight 81.6 kg (180 lb)     Height      Head Circumference      Peak Flow      Pain Score      Pain Loc      Pain Edu?      Excl. in GC?     Constitutional: Alert  leaning to the right side Eyes: Conjunctivae are normal. PERRL. EOMI. Head: Atraumatic. Mouth/Throat: Mucous membranes are moist.  Oropharynx non-erythematous. Neck: No stridor.  Cardiovascular: Normal rate, regular rhythm. Good peripheral circulation. Grossly normal heart sounds. Respiratory: Normal respiratory effort.  No retractions. Lungs CTAB. Gastrointestinal: Soft and nontender. No distention.   Musculoskeletal: No lower extremity tenderness nor edema. No gross deformities of extremities. Neurologic:  Nonverbal. No gross focal neurologic deficits are appreciated.  Skin:  Skin is warm, dry and intact. No rash noted. Psychiatric: Mood and affect are normal. Speech and behavior are normal.  ____________________________________________   LABS (all labs ordered are listed, but only abnormal results are displayed)  Labs Reviewed  DIFFERENTIAL - Abnormal; Notable for the following components:      Result Value   Neutro Abs 7.8 (*)    All other components within normal limits  COMPREHENSIVE METABOLIC PANEL - Abnormal; Notable for the following components:   Glucose, Bld 115 (*)    ALT 16 (*)    Total Bilirubin 1.6 (*)    All other components within normal limits  URINALYSIS, ROUTINE W REFLEX MICROSCOPIC - Abnormal; Notable for the following components:   Color, Urine YELLOW (*)    APPearance CLEAR (*)    Glucose, UA 50 (*)    All other components within normal limits  CBC  TROPONIN I  PROTIME-INR  APTT  GLUCOSE, CAPILLARY  CBG MONITORING, ED     RADIOLOGY I, [place] BROWN, personally viewed and evaluated these images (plain radiographs) as part of my medical decision  making, as well as reviewing the written report by the radiologist.  Ct Head Wo Contrast  Result Date: 04/11/2017 CLINICAL DATA:  Altered level of consciousness EXAM: CT HEAD WITHOUT CONTRAST TECHNIQUE: Contiguous axial images were obtained from the base of the skull through the vertex without intravenous contrast. COMPARISON:  02/20/2017 FINDINGS: Brain: No acute territorial infarction, hemorrhage, or intracranial mass is visualized. Mild to moderate atrophy. Stable ventricle size. Vascular: No hyperdense vessels.  Carotid artery calcification. Skull: Normal. Negative for fracture or focal lesion. Sinuses/Orbits: Mild mucosal thickening in the ethmoid sinuses. No acute orbital abnormality. Other: None IMPRESSION: No CT evidence for acute intracranial abnormality.  Atrophy Electronically Signed   By: Jasmine PangKim  Fujinaga M.D.   On: 04/11/2017 03:02      Procedures   ____________________________________________   INITIAL IMPRESSION / ASSESSMENT AND PLAN / ED COURSE  As part of my medical  decision making, I reviewed the following data within the electronic MEDICAL RECORD NUMBER 61 year old male with above stated history of physical exam concern for possible CVA. As such CT scan of the head was performed which revealed no acute intracranial abnormality. Laboratory data unremarkable. Discussed with Dr. Frederica Kuster admission for further CVA evaluation and placement for higher level of care Clinical Course as of Apr 12 1305  Wed Apr 11, 2017  [id number] Patient became increasingly agitated, requiring Ativan and Haldol.  [JW]    Clinical Course User Index [JW] Emily Filbert, MD    ____________________________________________  FINAL CLINICAL IMPRESSION(S) / ED DIAGNOSES  Final diagnoses:  Weakness     MEDICATIONS GIVEN DURING THIS VISIT:  Medications - No data to display   ED Discharge Orders    None       Note:  This document was prepared using Dragon voice recognition software and  may include unintentional dictation errors.    Darci Current, MD 04/11/17 [id number]    Darci Current, MD 04/12/17 [phone]

## 2017-04-11 NOTE — ED Notes (Signed)
[facility] called to inform them pt was en route to MRI and that he would then be coming to them. Per floor "that is okay with us!"

## 2017-04-11 NOTE — ED Notes (Addendum)
Miguel CapuchinHoward Renfro Father [phone]

## 2017-04-11 NOTE — Care Management Obs Status (Addendum)
MEDICARE OBSERVATION STATUS NOTIFICATION   Patient Details  Name: Miguel OresSamuel Paver MRN: [id number] Date of Birth: 09/01/1955   Medicare Observation Status Notification Given:  Yes MOON explained to patient's father Francisco CapuchinHoward Delancey over phone as patient has dementia. Copy delivered to patient's bedside to patient's father.   Collie SiadAngela Courtni Balash, RN 04/11/2017, 8:43 AM

## 2017-04-11 NOTE — ED Notes (Signed)
Dr. Thad Rangereynolds updated about patient by this nurse and will consult patient shortly.

## 2017-04-11 NOTE — ED Notes (Signed)
Upon walking by pts room, this RN saw pt standing at end of bed, monitor, gown and bp cuff off. Pt appeared confused, only stating "leave me alone" as I attempted to get him back to bed. Pt unsteady, and began getting combative, swinging and raising his voice to "leave me alone." Assistance requested, Dr Mayford KnifeWilliams at bedside, Amy, RN and Elita QuickPam, NT help assist pt back to bed, orders obtained.

## 2017-04-11 NOTE — Consult Note (Signed)
Reason for Consult:AMS Referring Physician: Gouru  CC: AMS  HPI: Janine OresSamuel Xie is an 61 y.o. male with a history of dementia.  Patient does not provide history due to mental status.  All history obtained from father.  Father reports that he was called to the facility because the patient's BP was high.  When he arrives the patient was leaning to the right and had tremors of his LUE.  At baseline needs assistance dressing.  Is incontinent.  Does recognize family but often gets lost in facility.  Eats with his hands.  Patient walks all night and sleeps most of the day.  Diagnosed 2015.  Past Medical History:  Diagnosis Date  . B12 deficiency   . Early onset Alzheimer's dementia   . HLD (hyperlipidemia)   . HTN (hypertension)     Past surgical history: Tonsillectomy  Family history: Father with HTN.  Mother with lung cancer (deceased)  Social History:  reports that  has never smoked. he has never used smokeless tobacco. He reports that he does not drink alcohol. His drug history is not on file.  No Known Allergies  Medications: I have reviewed the patient's current medications. Prior to Admission:  Prior to Admission medications   Medication Sig Start Date End Date Taking? Authorizing Provider  alum & mag hydroxide-simeth (MINTOX) 200-200-20 MG/5ML suspension Take 30 mLs by mouth as needed for indigestion or heartburn.   Yes [provider]  amLODipine (NORVASC) 10 MG tablet Take 10 mg by mouth daily.   Yes [provider]  aspirin EC 81 MG tablet Take 81 mg by mouth daily.   Yes [provider]  donepezil (ARICEPT) 10 MG tablet Take 10 mg by mouth at bedtime. 09/02/15  Yes [provider]  guaifenesin (ROBITUSSIN) 100 MG/5ML syrup Take 200 mg by mouth every 6 (six) hours as needed for cough.   Yes [provider]  haloperidol (HALDOL) 2 MG/ML solution Take 0.5 mg 2 (two) times daily by mouth.   Yes [provider]  loperamide (IMODIUM)  2 MG capsule Take 2 mg by mouth as needed for diarrhea or loose stools.   Yes [provider]  LORazepam (ATIVAN) 1 MG tablet Take 1 mg by mouth 3 (three) times daily.   Yes [provider]  magnesium hydroxide (MILK OF MAGNESIA) 400 MG/5ML suspension Take 30 mLs by mouth at bedtime as needed for constipation.   Yes [provider]  memantine (NAMENDA) 5 MG tablet Take 5 mg by mouth 2 (two) times daily.   Yes [provider]  QUEtiapine (SEROQUEL) 100 MG tablet Take 100 mg by mouth at bedtime.   Yes [provider]  sertraline (ZOLOFT) 50 MG tablet Take 50 mg by mouth daily.   Yes [provider]  vitamin B-12 (CYANOCOBALAMIN) 1000 MCG tablet Take 1,000 mcg by mouth daily.   Yes [provider]  haloperidol (HALDOL) 0.5 MG tablet Take 1 tablet (0.5 mg total) by mouth 3 (three) times daily. Patient not taking: Reported on 04/11/2017 02/20/17 02/20/18  Clapacs, Jackquline DenmarkJohn T, MD    ROS: Patient unable to provide due to mental status  Physical Examination: Blood pressure 107/78, pulse (!) 53, temperature 98 F (36.7 C), temperature source Oral, resp. rate 12, weight 81.6 kg (180 lb), SpO2 96 %.  HEENT-  Normocephalic, no lesions, without obvious abnormality.  Normal external eye and conjunctiva.  Normal TM's bilaterally.  Normal auditory canals and external ears. Normal external nose, mucus membranes and  septum.  Normal pharynx. Cardiovascular- S1, S2 normal, pulses palpable throughout   Lungs- chest clear, no wheezing, rales, normal symmetric air entry Abdomen- soft, non-tender; bowel sounds normal; no masses,  no organomegaly Extremities- no edema Lymph-no adenopathy palpable Musculoskeletal-no joint tenderness, deformity or swelling Skin-warm and dry, no hyperpigmentation, vitiligo, or suspicious lesions  Neurological Examination   Mental Status: Lethargic.  Follows most commands.  Speech minimal but fluent.  Thinks he is in a doctor's  office. Questionable left neglect.  Cranial Nerves: II: Will not allow discs to be visualized; Blinks to bilateral confrontation, pupils equal, round, reactive to light and accommodation III,IV, VI: ptosis not present, extra-ocular motions intact bilaterally V,VII: decrease in right NLF, facial light touch sensation normal bilaterally VIII: hearing normal bilaterally IX,X: gag reflex present XI: bilateral shoulder shrug XII: midline tongue extension Motor: Moves all extremities against gravity.  Moves the right more than the left. Intermittent jerking of the left Sensory: Appreciates noxious stimuli throughout.  When left stimulated though, reports that it is the right.   Deep Tendon Reflexes: 2+ in the upper extremities and RLE, 3+ in the LLE Plantars: Right: downgoing   Left: downgoing Cerebellar: Normal finger-to-nose testing on the right.  Despite multiple attempts can not get patient to use the left for testing.   Gait: not tested due to safety concerns    Laboratory Studies:   Basic Metabolic Panel: Recent Labs  Lab 04/11/17 0228  NA 138  K 3.7  CL 105  CO2 24  GLUCOSE 115*  BUN 17  CREATININE 1.17  CALCIUM 9.5    Liver Function Tests: Recent Labs  Lab 04/11/17 0228  AST 19  ALT 16*  ALKPHOS 57  BILITOT 1.6*  PROT 7.5  ALBUMIN 4.6   No results for input(s): LIPASE, AMYLASE in the last 168 hours. No results for input(s): AMMONIA in the last 168 hours.  CBC: Recent Labs  Lab 04/11/17 0228  WBC 10.2  NEUTROABS 7.8*  HGB 15.7  HCT 45.8  MCV 91.8  PLT 192    Cardiac Enzymes: Recent Labs  Lab 04/11/17 0228  TROPONINI <0.03    BNP: Invalid input(s): POCBNP  CBG: Recent Labs  Lab 04/11/17 0359  GLUCAP 95    Microbiology: No results found for this or any previous visit.  Coagulation Studies: Recent Labs    04/11/17 0303  LABPROT 13.9  INR 1.08    Urinalysis:  Recent Labs  Lab 04/11/17 0259  COLORURINE YELLOW*  LABSPEC 1.016   PHURINE 6.0  GLUCOSEU 50*  HGBUR NEGATIVE  BILIRUBINUR NEGATIVE  KETONESUR NEGATIVE  PROTEINUR NEGATIVE  NITRITE NEGATIVE  LEUKOCYTESUR NEGATIVE    Lipid Panel:  No results found for: CHOL, TRIG, HDL, CHOLHDL, VLDL, LDLCALC  HgbA1C:  Lab Results  Component Value Date   HGBA1C 5.4 04/11/2017    Urine Drug Screen:  No results found for: LABOPIA, COCAINSCRNUR, LABBENZ, AMPHETMU, THCU, LABBARB  Alcohol Level: No results for input(s): ETH in the last 168 hours.   Imaging: Ct Head Wo Contrast  Result Date: 04/11/2017 CLINICAL DATA:  Altered level of consciousness EXAM: CT HEAD WITHOUT CONTRAST TECHNIQUE: Contiguous axial images were obtained from the base of the skull through the vertex without intravenous contrast. COMPARISON:  02/20/2017 FINDINGS: Brain: No acute territorial infarction, hemorrhage, or intracranial mass is visualized. Mild to moderate atrophy. Stable ventricle size. Vascular: No hyperdense vessels.  Carotid artery calcification. Skull: Normal. Negative for fracture or focal lesion. Sinuses/Orbits: Mild mucosal thickening in the ethmoid sinuses.  No acute orbital abnormality. Other: None IMPRESSION: No CT evidence for acute intracranial abnormality.  Atrophy Electronically Signed   By: Jasmine Pang M.D.   On: 04/11/2017 03:02     Assessment/Plan: 61 year old male presenting with altered mental status.  Head CT reviewed and shows no acute changes.  Blood work unrevealing as well.  Although this may represent progression of his degenerative illness can not rule out other possibilities.  Further work up recommended.  Recommendations: 1.  EEG 2.  MRI of the brain without contrast   3. Physical therapy 4. B12  Thana Farr, MD Neurology [phone] 04/11/2017, 2:08 PM

## 2017-04-11 NOTE — ED Notes (Signed)
Patient transported to Ultrasound 

## 2017-04-11 NOTE — ED Notes (Signed)
Dr. Reynolds at bedside.

## 2017-04-11 NOTE — Progress Notes (Signed)
[facility] [place] [facility]   [facility]    MR#:  [id number]  DATE OF BIRTH:  07/20/1955  SUBJECTIVE:  CHIEF COMPLAINT: Patient is very lethargic during my examination just mumbling to verbal commands has chronic early onset dementia  REVIEW OF SYSTEMS:  Review of system unobtainable from chronic dementia  DRUG ALLERGIES:  No Known Allergies  VITALS:  Blood pressure (!) 141/82, pulse 68, temperature 97.8 F (36.6 C), resp. rate 14, height 5\' 8"  (1.727 m), weight 81.3 kg (179 lb 4.8 oz), SpO2 97 %.  PHYSICAL EXAMINATION:  GENERAL:  61 y.o.-year-old patient lying in the bed with no acute distress.  EYES: Pupils equal, round, reactive to light and accommodation. No scleral icterus. Extraocular muscles intact.  HEENT: Head atraumatic, normocephalic. Oropharynx and nasopharynx clear.  NECK:  Supple, no jugular venous distention. No thyroid enlargement, no tenderness.  LUNGS: Normal breath sounds bilaterally, no wheezing, rales,rhonchi or crepitation. No use of accessory muscles of respiration.  CARDIOVASCULAR: S1, S2 normal. No murmurs, rubs, or gallops.  ABDOMEN: Soft, nontender, nondistended. Bowel sounds present. No organomegaly or mass.  EXTREMITIES: No pedal edema, cyanosis, or clubbing.  NEUROLOGIC: Patient is arousable but disoriented PSYCHIATRIC: The patient is lethargic and disoriented  SKIN: No obvious rash, lesion, or ulcer.    LABORATORY PANEL:   CBC Recent Labs  Lab 04/11/17 0228  WBC 10.2  HGB 15.7  HCT 45.8  PLT 192   ------------------------------------------------------------------------------------------------------------------  Chemistries  Recent Labs  Lab 04/11/17 0228  NA 138  K 3.7  CL 105  CO2 24  GLUCOSE 115*  BUN 17  CREATININE 1.17  CALCIUM 9.5  AST 19  ALT 16*  ALKPHOS 57  BILITOT 1.6*    ------------------------------------------------------------------------------------------------------------------  Cardiac Enzymes Recent Labs  Lab 04/11/17 0228  TROPONINI <0.03   ------------------------------------------------------------------------------------------------------------------  RADIOLOGY:  Ct Head Wo Contrast  Result Date: 04/11/2017 CLINICAL DATA:  Altered level of consciousness EXAM: CT HEAD WITHOUT CONTRAST TECHNIQUE: Contiguous axial images were obtained from the base of the skull through the vertex without intravenous contrast. COMPARISON:  02/20/2017 FINDINGS: Brain: No acute territorial infarction, hemorrhage, or intracranial mass is visualized. Mild to moderate atrophy. Stable ventricle size. Vascular: No hyperdense vessels.  Carotid artery calcification. Skull: Normal. Negative for fracture or focal lesion. Sinuses/Orbits: Mild mucosal thickening in the ethmoid sinuses. No acute orbital abnormality. Other: None IMPRESSION: No CT evidence for acute intracranial abnormality.  Atrophy Electronically Signed   By: Jasmine PangKim  Fujinaga M.D.   On: 04/11/2017 03:02   Mr Brain Wo Contrast  Result Date: 04/11/2017 CLINICAL DATA:  61 year old male with altered mental status. Dementia diagnosed in 2015. Hypertensive and new tremor of the left upper extremity. EXAM: MRI HEAD WITHOUT CONTRAST TECHNIQUE: Multiplanar, multiecho pulse sequences of the brain and surrounding structures were obtained without intravenous contrast. COMPARISON:  Head CT 0245 hours today.  Brain MRI 06/09/2015. FINDINGS: Brain: No restricted diffusion to suggest acute infarction. No midline shift, mass effect, evidence of mass lesion, ventriculomegaly, extra-axial collection or acute intracranial hemorrhage. Cervicomedullary junction and pituitary are within normal limits. Cerebral volume appears stable since 2017. Biparietal volume loss. Mesial temporal lobe volume loss. Wallace CullensGray and white matter signal appears stable  and is largely normal for age. No chronic cerebral blood products. Negative deep gray matter nuclei, brainstem and cerebellum. Vascular: Major intracranial vascular flow voids are stable. Skull and upper cervical spine: Negative. Visualized bone marrow signal is within normal limits. Sinuses/Orbits: Stable and negative. Other:  Visible internal auditory structures appear normal. Mastoid air cells remain clear. Negative scalp and face soft tissues. IMPRESSION: 1.  No acute intracranial abnormality. 2. Stable noncontrast MRI appearance of the brain since 2017. Chronic Biparietal and mesial temporal lobe volume loss. Electronically Signed   By: Odessa Fleming M.D.   On: 04/11/2017 17:38   US Carotid Bilateral (at [facility] Only)  Result Date: 04/11/2017 CLINICAL DATA:  Stroke.  History of hypertension and hyperlipidemia. EXAM: BILATERAL CAROTID DUPLEX ULTRASOUND TECHNIQUE: Wallace Cullens scale imaging, color Doppler and duplex ultrasound were performed of bilateral carotid and vertebral arteries in the neck. COMPARISON:  None. FINDINGS: Criteria: Quantification of carotid stenosis is based on velocity parameters that correlate the residual internal carotid diameter with NASCET-based stenosis levels, using the diameter of the distal internal carotid lumen as the denominator for stenosis measurement. The following velocity measurements were obtained: RIGHT ICA:  67/17 cm/sec CCA:  77/14 cm/sec SYSTOLIC ICA/CCA RATIO:  0.9 DIASTOLIC ICA/CCA RATIO:  1.2 ECA:  73 cm/sec LEFT ICA:  55/18 cm/sec CCA:  82/17 cm/sec SYSTOLIC ICA/CCA RATIO:  0.7 DIASTOLIC ICA/CCA RATIO:  1.1 ECA:  84 cm/sec RIGHT CAROTID ARTERY: There is no grayscale evidence of significant intimal thickening or atherosclerotic plaque affecting interrogated portions of the right carotid system. There are no elevated peak systolic velocities within the interrogated course the right internal carotid artery to suggest a hemodynamically significant stenosis. RIGHT VERTEBRAL  ARTERY:  Antegrade Flow LEFT CAROTID ARTERY: There is a minimal amount of intimal thickening/atherosclerotic plaque within the left carotid bulb (image 52), extending to involve the origin and proximal aspects of the left internal carotid artery (image 62), not resulting in elevated peak systolic velocities within the interrogated course the left internal carotid artery to suggest a hemodynamically significant stenosis. LEFT VERTEBRAL ARTERY:  Antegrade flow IMPRESSION: 1. Minimal amount of left-sided atherosclerotic plaque, not resulting in a hemodynamically significant stenosis. 2. Normal sonographic evaluation of the right carotid system. Electronically Signed   By: Simonne Come M.D.   On: 04/11/2017 15:11    EKG:   Orders placed or performed during the hospital encounter of 04/11/17  . ED EKG  . ED EKG    ASSESSMENT AND PLAN:   This is a 61 year old male admitted for CVA.  1.  Delirium on chronic baseline dementia; CVA Suspected due to clinical presentation.  Likely sequelae of early onset Alzheimer's. CT negative for ischemia.   Obtain MRI, EEG Appreciate neurology recommendations Carotid Dopplers with minimal ICA stenosis which is not significant PT eval is pending.   Check B12 levels   2.  Hypertension: Controlled; continue amlodipine  3.  Early onset dementia with intermittent episodes of agitation  the patient is nonverbal at this time.   Continue Aricept, Namenda, sertraline, Seroquel and donepezil  patient did not pass bedside swallow evaluation.  Speech therapy consulted for swallow eval n.p.o. until speech therapy evaluation   4.  Hyperlipidemia: Continuation of statin therapy       All the records are reviewed and case discussed with Care Management/Social Workerr.  CODE STATUS: fc  TOTAL TIME TAKING CARE OF THIS PATIENT: 34 minutes.   POSSIBLE D/C IN 1-2DAYS, DEPENDING ON CLINICAL CONDITION.  Note: This dictation was prepared with Dragon dictation along with  smaller phrase technology. Any transcriptional errors that result from this process are unintentional.   Ramonita Lab M.D on 04/11/2017 at 10:17 PM  Between 7am to 6pm - Pager - [phone] After 6pm go to [email or web address] - password  [facility]  [facility]  Office  [phone]  CC: Primary care physician; Marguarite ArbourSparks, Jeffrey D, MD

## 2017-04-11 NOTE — Care Management Note (Addendum)
Case Management Note  Patient Details  Name: Janine OresSamuel Yanik MRN: [id number] Date of Birth: 01/13/1956  Subjective/Objective:                   RNCM spoke with patient's father Dimas AguasHoward by phone [phone]. [facility]. He has been there for approximately 2 years. His father says, up until last night, patient was able to ambulate alone however he had to hold patient up to walk. He states his mental status has declined so much over the last year that he has been speaking with facility about a higher level of care however he admits he doesn't fully understand what patient truly needs. He states that patient is incontinent of bowel and urine. He feeds himself with his hands because he cannot use fork, spoon, knife. He states his speech cannot be "put together". He requires someone to give him a shower. 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 He states he/Patient lost his job due to mental decline and now receives disability. His PCP is Dr. Aram BeechamJeffrey Sparks. His neurologist is Dr. Sherryll BurgerShah. Patient is not receiving PT at facility.  Action/Plan: Father requests help getting patient the help he needs at discharge. I have requested PT, OT, and SLP evaluation. He has been updated on short-term and long-term plans and the evaluation of patient's ability to rehab. CSW consult put in place. RNCM will follow.    Expected Discharge Date:                  Expected Discharge Plan:     In-House Referral:  Clinical Social Work  Discharge planning Services  CM Consult  Post Acute Care Choice:    Choice offered to:  Parent  DME Arranged:    DME Agency:     HH Arranged:    HH Agency:     Status of Service:  In process, will continue to follow  If discussed at Long Length of Stay Meetings, dates discussed:    Additional Comments:  Collie Siadngela Omar Orrego, RN 04/11/2017, 8:32 AM

## 2017-04-11 NOTE — ED Notes (Signed)
Discussed patient's NPO status with Dr. Amado CoeGouru.  Holding all oral medications until swallow study can be performed by speech therapist.

## 2017-04-11 NOTE — ED Notes (Signed)
[facility] RN until patient is ready to go to admission room.

## 2017-04-11 NOTE — Progress Notes (Signed)
OT Cancellation Note  Patient Details Name: Miguel OresSamuel Herskowitz MRN: [id number] DOB: 10/16/1955   Cancelled Treatment:    Reason Eval/Treat Not Completed: Patient at procedure or test/ unavailable. Order received, chart reviewed. Spoke with RN, pt out of room for testing. MRI pending. Will re-attempt OT evaluation at later date/time as pt is available and medically appropriate.  Richrd PrimeJamie Stiller, MPH, MS, OTR/L ascom [phone] 04/11/17, 2:47 PM

## 2017-04-11 NOTE — ED Notes (Signed)
Patient transported to MRI with Thon, EDT.

## 2017-04-11 NOTE — ED Notes (Signed)
Oral medications have not been administered due to failed stroke swallow screen.

## 2017-04-11 NOTE — ED Notes (Signed)
ED Provider at bedside. 

## 2017-04-11 NOTE — H&P (Signed)
Miguel Tran is an 61 y.o. male.   Chief Complaint: Hypertension HPI: The patient with past medical history of early onset dementia and hypertension presents to the emergency department via EMS from his nursing home due to elevated blood pressure.  This alarmed the assisted living staff as the patient had some jerking or fasciculation of his left hand while tilting his head to the right.  He has been less able to feed himself over the last few days per staff report.  This evening he has been essentially nonverbal.  CT of the patient's head does not show acute infarct.  However the patient's rapid change in neurologic status suggest possible stroke which prompted the emergency department staff to call the hospitalist service for admission.  Past Medical History:  Diagnosis Date  . B12 deficiency   . Early onset Alzheimer's dementia   . HLD (hyperlipidemia)   . HTN (hypertension)     No past surgical history on file.  Cannot communicate at this time  No family history on file. Patient cannot contribute to his own history Social History:  reports that  has never smoked. he has never used smokeless tobacco. He reports that he does not drink alcohol. His drug history is not on file.  Allergies: No Known Allergies   (Not in a hospital admission)  Results for orders placed or performed during the hospital encounter of 04/11/17 (from the past 48 hour(s))  CBC     Status: None   Collection Time: 04/11/17  2:28 AM  Result Value Ref Range   WBC 10.2 3.8 - 10.6 K/uL   RBC 4.99 4.40 - 5.90 MIL/uL   Hemoglobin 15.7 13.0 - 18.0 g/dL   HCT 45.8 40.0 - 52.0 %   MCV 91.8 80.0 - 100.0 fL   MCH 31.4 26.0 - 34.0 pg   MCHC 34.2 32.0 - 36.0 g/dL   RDW 13.6 11.5 - 14.5 %   Platelets 192 150 - 440 K/uL  Differential     Status: Abnormal   Collection Time: 04/11/17  2:28 AM  Result Value Ref Range   Neutrophils Relative % 76 %   Neutro Abs 7.8 (H) 1.4 - 6.5 K/uL   Lymphocytes Relative 14 %   Lymphs Abs  1.4 1.0 - 3.6 K/uL   Monocytes Relative 10 %   Monocytes Absolute 1.0 0.2 - 1.0 K/uL   Eosinophils Relative 0 %   Eosinophils Absolute 0.0 0 - 0.7 K/uL   Basophils Relative 0 %   Basophils Absolute 0.0 0 - 0.1 K/uL  Comprehensive metabolic panel     Status: Abnormal   Collection Time: 04/11/17  2:28 AM  Result Value Ref Range   Sodium 138 135 - 145 mmol/L   Potassium 3.7 3.5 - 5.1 mmol/L   Chloride 105 101 - 111 mmol/L   CO2 24 22 - 32 mmol/L   Glucose, Bld 115 (H) 65 - 99 mg/dL   BUN 17 6 - 20 mg/dL   Creatinine, Ser 1.17 0.61 - 1.24 mg/dL   Calcium 9.5 8.9 - 10.3 mg/dL   Total Protein 7.5 6.5 - 8.1 g/dL   Albumin 4.6 3.5 - 5.0 g/dL   AST 19 15 - 41 U/L   ALT 16 (L) 17 - 63 U/L   Alkaline Phosphatase 57 38 - 126 U/L   Total Bilirubin 1.6 (H) 0.3 - 1.2 mg/dL   GFR calc non Af Amer >60 >60 mL/min   GFR calc Af Amer >60 >60 mL/min  Comment: (NOTE) The eGFR has been calculated using the CKD EPI equation. This calculation has not been validated in all clinical situations. eGFR's persistently <60 mL/min signify possible Chronic Kidney Disease.    Anion gap 9 5 - 15  Troponin I     Status: None   Collection Time: 04/11/17  2:28 AM  Result Value Ref Range   Troponin I <0.03 <0.03 ng/mL  Urinalysis, Routine w reflex microscopic     Status: Abnormal   Collection Time: 04/11/17  2:59 AM  Result Value Ref Range   Color, Urine YELLOW (A) YELLOW   APPearance CLEAR (A) CLEAR   Specific Gravity, Urine 1.016 1.005 - 1.030   pH 6.0 5.0 - 8.0   Glucose, UA 50 (A) NEGATIVE mg/dL   Hgb urine dipstick NEGATIVE NEGATIVE   Bilirubin Urine NEGATIVE NEGATIVE   Ketones, ur NEGATIVE NEGATIVE mg/dL   Protein, ur NEGATIVE NEGATIVE mg/dL   Nitrite NEGATIVE NEGATIVE   Leukocytes, UA NEGATIVE NEGATIVE  Protime-INR     Status: None   Collection Time: 04/11/17  3:03 AM  Result Value Ref Range   Prothrombin Time 13.9 11.4 - 15.2 seconds   INR 1.08   APTT     Status: None   Collection Time:  04/11/17  3:03 AM  Result Value Ref Range   aPTT 35 24 - 36 seconds  Glucose, capillary     Status: None   Collection Time: 04/11/17  3:59 AM  Result Value Ref Range   Glucose-Capillary 95 65 - 99 mg/dL   Ct Head Wo Contrast  Result Date: 04/11/2017 CLINICAL DATA:  Altered level of consciousness EXAM: CT HEAD WITHOUT CONTRAST TECHNIQUE: Contiguous axial images were obtained from the base of the skull through the vertex without intravenous contrast. COMPARISON:  02/20/2017 FINDINGS: Brain: No acute territorial infarction, hemorrhage, or intracranial mass is visualized. Mild to moderate atrophy. Stable ventricle size. Vascular: No hyperdense vessels.  Carotid artery calcification. Skull: Normal. Negative for fracture or focal lesion. Sinuses/Orbits: Mild mucosal thickening in the ethmoid sinuses. No acute orbital abnormality. Other: None IMPRESSION: No CT evidence for acute intracranial abnormality.  Atrophy Electronically Signed   By: Donavan Foil M.D.   On: 04/11/2017 03:02    Review of Systems  Unable to perform ROS: Medical condition    Blood pressure 118/70, pulse 72, temperature 98 F (36.7 C), temperature source Oral, resp. rate 11, weight 81.6 kg (180 lb), SpO2 94 %. Physical Exam  Constitutional: He appears well-developed and well-nourished. He appears lethargic. No distress.  HENT:  Head: Normocephalic and atraumatic.  Mouth/Throat: Oropharynx is clear and moist.  Eyes: Conjunctivae and EOM are normal. Pupils are equal, round, and reactive to light. No scleral icterus.  Neck: Normal range of motion. Neck supple. No JVD present. No tracheal deviation present. No thyromegaly present.  Cardiovascular: Normal rate, regular rhythm and normal heart sounds. Exam reveals no gallop and no friction rub.  No murmur heard. Respiratory: Effort normal and breath sounds normal. No respiratory distress.  GI: Soft. Bowel sounds are normal. He exhibits no distension. There is no tenderness.   Genitourinary:  Genitourinary Comments: Deferred  Musculoskeletal: Normal range of motion. He exhibits no edema.  Lymphadenopathy:    He has no cervical adenopathy.  Neurological: He appears lethargic. A cranial nerve deficit (Patient is uncooperative with exam) is present.  Skin: Skin is warm and dry. No rash noted. No erythema.     Assessment/Plan This is a 61 year old male admitted for CVA. 1.  CVA: Suspected due to clinical presentation.  Likely sequelae of early onset Alzheimer's.CT negative for ischemia.  Obtain MRI.  Consult neurology. 2.  Hypertension: Controlled; continue amlodipine 3.  Early onset dementia: The patient is nonverbal at this time.  Continue Aricept, Namenda, sertraline, Seroquel and donepezil assuming the patient passes swallow evaluation. 4.  Hyperlipidemia: Continuation of statin therapy at the discretion of primary team. The patient is a full code.  Time spent on admission orders and patient care approximately 45 minutes  Harrie Foreman, MD 04/11/2017, 7:15 AM

## 2017-04-11 NOTE — ED Notes (Signed)
Patient transported to CT 

## 2017-04-11 NOTE — ED Notes (Signed)
Patient linens and diaper changed and cleaned by this nurse and Penni BombardKendall, RN.  Family in room at bedside and given update.

## 2017-04-11 NOTE — ED Notes (Signed)
Call to father Dimas AguasHoward with update on night. He will come later this am to see him

## 2017-04-11 NOTE — ED Notes (Signed)
Patient resting quietly at this time.  Fall mats placed in room and bed alarm placed on patient.  Respirations even and non-labored.

## 2017-04-11 NOTE — ED Notes (Signed)
EEG just contacted this RN stating the pt is done with his EEG but will bring pt to MRI if parents can answer screening questions. Father of pt on phone with MRI at this time answering screening questions. MRI has one outpt on the table now but can scan pt in about 15-20 minutes. Plan at this point is to send pt to MRI from his current location in EEG then to his room on the floor (124).

## 2017-04-11 NOTE — ED Notes (Signed)
Per pts father his dementia has increased over the past 2 weeks. He is unable to feed or dress himself and cannot walk without assistance. Pt is very slow at following directions but does attempt to participate in what is asked of him

## 2017-04-11 NOTE — Progress Notes (Signed)
*  PRELIMINARY RESULTS* Echocardiogram 2D Echocardiogram has been performed.  Cristela BlueHege, Nylia Gavina 04/11/2017, 4:17 PM

## 2017-04-11 NOTE — ED Triage Notes (Signed)
Pt brought in by father from [facility] for htn and pt was leaning towards the right when he was walking. Pt noted to be leaning towards the right int he wheelchair. Son states he was having trouble walking tonight and pt normally walks independently.

## 2017-04-12 DIAGNOSIS — R5383 Other fatigue: Secondary | ICD-10-CM

## 2017-04-12 DIAGNOSIS — R531 Weakness: Secondary | ICD-10-CM

## 2017-04-12 LAB — VITAMIN B12
VITAMIN B 12: 1918 pg/mL — AB (ref 180–914)
Vitamin B-12: 1791 pg/mL — ABNORMAL HIGH (ref 180–914)

## 2017-04-12 LAB — LIPID PANEL
Cholesterol: 198 mg/dL (ref 0–200)
HDL: 47 mg/dL (ref >40)
LDL Cholesterol: 125 mg/dL — ABNORMAL HIGH (ref 0–99)
Total CHOL/HDL Ratio: 4.2 RATIO
Triglycerides: 129 mg/dL (ref <150)
VLDL: 26 mg/dL (ref 0–40)

## 2017-04-12 LAB — HEMOGLOBIN A1C
Hgb A1c MFr Bld: 5.3 % (ref 4.8–5.6)
Mean Plasma Glucose: 105.41 mg/dL

## 2017-04-12 LAB — ECHOCARDIOGRAM COMPLETE: Weight: 2880 oz

## 2017-04-12 LAB — MRSA PCR SCREENING: MRSA by PCR: NEGATIVE

## 2017-04-12 MED ORDER — HALOPERIDOL LACTATE 5 MG/ML IJ SOLN
INTRAMUSCULAR | Status: AC
  Administered 2017-04-12: 5 mg via INTRAMUSCULAR
  Filled 2017-04-12: qty 1

## 2017-04-12 MED ORDER — LORAZEPAM 1 MG PO TABS
1.0000 mg | ORAL_TABLET | Freq: Three times a day (TID) | ORAL | Status: DC
  Administered 2017-04-12 – 2017-04-14 (×5): 1 mg via ORAL
  Filled 2017-04-12 (×5): qty 1

## 2017-04-12 MED ORDER — LORAZEPAM 2 MG/ML IJ SOLN
INTRAMUSCULAR | Status: AC
  Filled 2017-04-12: qty 1

## 2017-04-12 MED ORDER — HALOPERIDOL LACTATE 5 MG/ML IJ SOLN
5.0000 mg | Freq: Four times a day (QID) | INTRAMUSCULAR | Status: DC | PRN
Start: 2017-04-12 — End: 2017-04-14
  Administered 2017-04-12: 5 mg via INTRAMUSCULAR

## 2017-04-12 MED ORDER — LORAZEPAM 2 MG/ML IJ SOLN
1.0000 mg | INTRAMUSCULAR | Status: AC | PRN
  Administered 2017-04-12: 01:00:00 1 mg via INTRAMUSCULAR
  Filled 2017-04-12: qty 1

## 2017-04-12 MED ORDER — HALOPERIDOL 5 MG PO TABS
5.0000 mg | ORAL_TABLET | Freq: Four times a day (QID) | ORAL | Status: DC | PRN
  Filled 2017-04-12: qty 1

## 2017-04-12 MED ORDER — ZIPRASIDONE MESYLATE 20 MG IM SOLR
20.0000 mg | Freq: Once | INTRAMUSCULAR | Status: AC
  Administered 2017-04-12: 20 mg via INTRAMUSCULAR
  Filled 2017-04-12: qty 20

## 2017-04-12 NOTE — Progress Notes (Signed)
[facility] [place] [facility]   PATIENT NAME: Miguel Tran    MR#:  [id number]  DATE OF BIRTH:  06/05/1955  SUBJECTIVE:  CHIEF COMPLAINT: Patient currently sleepy His father at the bedside  REVIEW OF SYSTEMS:  Review of system unobtainable from chronic dementia  DRUG ALLERGIES:  No Known Allergies  VITALS:  Blood pressure 116/73, pulse 78, temperature 98.7 F (37.1 C), temperature source Oral, resp. rate 18, height 5\' 8"  (1.727 m), weight 169 lb (76.7 kg), SpO2 97 %.  PHYSICAL EXAMINATION:  GENERAL:  61 y.o.-year-old patient lying in the bed with no acute distress.  EYES: Pupils equal, round, reactive to light and accommodation. No scleral icterus. Extraocular muscles intact.  HEENT: Head atraumatic, normocephalic. Oropharynx and nasopharynx clear.  NECK:  Supple, no jugular venous distention. No thyroid enlargement, no tenderness.  LUNGS: Normal breath sounds bilaterally, no wheezing, rales,rhonchi or crepitation. No use of accessory muscles of respiration.  CARDIOVASCULAR: S1, S2 normal. No murmurs, rubs, or gallops.  ABDOMEN: Soft, nontender, nondistended. Bowel sounds present. No organomegaly or mass.  EXTREMITIES: No pedal edema, cyanosis, or clubbing.  NEUROLOGIC: He is sleepy PSYCHIATRIC: The patient is sleepy  sKIN: No obvious rash, lesion, or ulcer.    LABORATORY PANEL:   CBC Recent Labs  Lab 04/11/17 0228  WBC 10.2  HGB 15.7  HCT 45.8  PLT 192   ------------------------------------------------------------------------------------------------------------------  Chemistries  Recent Labs  Lab 04/11/17 0228  NA 138  K 3.7  CL 105  CO2 24  GLUCOSE 115*  BUN 17  CREATININE 1.17  CALCIUM 9.5  AST 19  ALT 16*  ALKPHOS 57  BILITOT 1.6*   ------------------------------------------------------------------------------------------------------------------  Cardiac Enzymes Recent Labs  Lab 04/11/17 0228  TROPONINI <0.03    ------------------------------------------------------------------------------------------------------------------  RADIOLOGY:  Ct Head Wo Contrast  Result Date: 04/11/2017 CLINICAL DATA:  Altered level of consciousness EXAM: CT HEAD WITHOUT CONTRAST TECHNIQUE: Contiguous axial images were obtained from the base of the skull through the vertex without intravenous contrast. COMPARISON:  02/20/2017 FINDINGS: Brain: No acute territorial infarction, hemorrhage, or intracranial mass is visualized. Mild to moderate atrophy. Stable ventricle size. Vascular: No hyperdense vessels.  Carotid artery calcification. Skull: Normal. Negative for fracture or focal lesion. Sinuses/Orbits: Mild mucosal thickening in the ethmoid sinuses. No acute orbital abnormality. Other: None IMPRESSION: No CT evidence for acute intracranial abnormality.  Atrophy Electronically Signed   By: Jasmine PangKim  Fujinaga M.D.   On: 04/11/2017 03:02   Mr Brain Wo Contrast  Result Date: 04/11/2017 CLINICAL DATA:  61 year old male with altered mental status. Dementia diagnosed in 2015. Hypertensive and new tremor of the left upper extremity. EXAM: MRI HEAD WITHOUT CONTRAST TECHNIQUE: Multiplanar, multiecho pulse sequences of the brain and surrounding structures were obtained without intravenous contrast. COMPARISON:  Head CT 0245 hours today.  Brain MRI 06/09/2015. FINDINGS: Brain: No restricted diffusion to suggest acute infarction. No midline shift, mass effect, evidence of mass lesion, ventriculomegaly, extra-axial collection or acute intracranial hemorrhage. Cervicomedullary junction and pituitary are within normal limits. Cerebral volume appears stable since 2017. Biparietal volume loss. Mesial temporal lobe volume loss. Wallace CullensGray and white matter signal appears stable and is largely normal for age. No chronic cerebral blood products. Negative deep gray matter nuclei, brainstem and cerebellum. Vascular: Major intracranial vascular flow voids are stable.  Skull and upper cervical spine: Negative. Visualized bone marrow signal is within normal limits. Sinuses/Orbits: Stable and negative. Other: Visible internal auditory structures appear normal. Mastoid air cells remain clear. Negative scalp  and face soft tissues. IMPRESSION: 1.  No acute intracranial abnormality. 2. Stable noncontrast MRI appearance of the brain since 2017. Chronic Biparietal and mesial temporal lobe volume loss. Electronically Signed   By: Odessa FlemingH  Hall M.D.   On: 04/11/2017 17:38   [place] Carotid Bilateral (at [facility] Only)  Result Date: 04/11/2017 CLINICAL DATA:  Stroke.  History of hypertension and hyperlipidemia. EXAM: BILATERAL CAROTID DUPLEX ULTRASOUND TECHNIQUE: Wallace CullensGray scale imaging, color Doppler and duplex ultrasound were performed of bilateral carotid and vertebral arteries in the neck. COMPARISON:  None. FINDINGS: Criteria: Quantification of carotid stenosis is based on velocity parameters that correlate the residual internal carotid diameter with NASCET-based stenosis levels, using the diameter of the distal internal carotid lumen as the denominator for stenosis measurement. The following velocity measurements were obtained: RIGHT ICA:  67/17 cm/sec CCA:  77/14 cm/sec SYSTOLIC ICA/CCA RATIO:  0.9 DIASTOLIC ICA/CCA RATIO:  1.2 ECA:  73 cm/sec LEFT ICA:  55/18 cm/sec CCA:  82/17 cm/sec SYSTOLIC ICA/CCA RATIO:  0.7 DIASTOLIC ICA/CCA RATIO:  1.1 ECA:  84 cm/sec RIGHT CAROTID ARTERY: There is no grayscale evidence of significant intimal thickening or atherosclerotic plaque affecting interrogated portions of the right carotid system. There are no elevated peak systolic velocities within the interrogated course the right internal carotid artery to suggest a hemodynamically significant stenosis. RIGHT VERTEBRAL ARTERY:  Antegrade Flow LEFT CAROTID ARTERY: There is a minimal amount of intimal thickening/atherosclerotic plaque within the left carotid bulb (image 52), extending to involve the origin  and proximal aspects of the left internal carotid artery (image 62), not resulting in elevated peak systolic velocities within the interrogated course the left internal carotid artery to suggest a hemodynamically significant stenosis. LEFT VERTEBRAL ARTERY:  Antegrade flow IMPRESSION: 1. Minimal amount of left-sided atherosclerotic plaque, not resulting in a hemodynamically significant stenosis. 2. Normal sonographic evaluation of the right carotid system. Electronically Signed   By: Simonne ComeJohn  Watts M.D.   On: 04/11/2017 15:11    EKG:   Orders placed or performed during the hospital encounter of 04/11/17  . ED EKG  . ED EKG    ASSESSMENT AND PLAN:   This is a 61 year old male admitted for CVA.  1.  Acute delirium on chronic baseline dementia; CVA Suspected due to clinical presentation.  Likely sequelae of early onset Alzheimer's. MRI of the brain is negative Appreciate neurology recommendations Carotid Dopplers with minimal ICA stenosis which is not significant PT eval is pending.   B12 level elevated   2.  Hypertension: Controlled; continue amlodipine  3.  Early onset dementia with intermittent episodes of agitation  the patient is nonverbal at this time.   Continue Aricept, Namenda, sertraline, Seroquel and donepezil   4.  Hyperlipidemia: Continuation of statin therapy    Discussed with the father that we will discharge patient tomorrow back to [facility] if he is stable   All the records are reviewed and case discussed with Care Management/Social Workerr.  CODE STATUS: fc  TOTAL TIME TAKING CARE OF THIS PATIENT: 34 minutes.   POSSIBLE D/C IN 1-2DAYS, DEPENDING ON CLINICAL CONDITION.  Note: This dictation was prepared with Dragon dictation along with smaller phrase technology. Any transcriptional errors that result from this process are unintentional.   Auburn BilberryPATEL, Nakyla Bracco M.D on 04/12/2017 at 2:34 PM  Between 7am to 6pm - Pager - [phone] After 6pm go to [email or web address]  - password [email or web address] [facility]  [facility]  Office  [phone]  CC: Primary care physician; Marguarite ArbourSparks, Jeffrey D,  MD

## 2017-04-12 NOTE — Clinical Social Work Note (Addendum)
Clinical Social Work Assessment  Patient Details  Name: Miguel OresSamuel Heitman MRN: [id number] Date of Birth: 03/07/1956  Date of referral:  04/05/17               Reason for consult:  Discharge Planning, Facility Placement                Permission sought to share information with:  Oceanographeracility Contact Representative Permission granted to share information::  Yes, Verbal Permission Granted  Name::      [facility]   Agency::   [place]  Relationship::     Contact Information:     Housing/Transportation Living arrangements for the past 2 months:  Assisted Living Facility Source of Information:  Parent Patient Interpreter Needed:  None Criminal Activity/Legal Involvement Pertinent to Current Situation/Hospitalization:  No - Comment as needed Significant Relationships:  Parents Lives with:  Facility Resident Do you feel safe going back to the place where you live?  Yes Need for family participation in patient care:  Yes (Comment)  Care giving concerns:  Patient has been a resident at [facility] for 2 years (fax: [phone]).   Social Worker assessment / plan: Visual merchandiserClinical Social Worker (CSW) received consult that patient is from an assisted living facility. Social work Tax inspectorintern attempted to meet with patient but he was unable to complete assessment due to his confusion. Patient was laying in bed, alert and oriented only to self and had a sitter at bedside. Social work Tax inspectorintern was able to get in contact with patient's father/HPOA Dimas AguasHoward [phone]). Social work Tax inspectorintern introduced self and explained the role of the CSW department. Patient's father confirmed that patient has been a resident at [facility] for the past 2 years. Patient's [place] wife. Patient's father also shared that patient at baseline can walk indpendently without an assistive device. CSW explained to patient's father that PT has not assessed patient yet however patient appears strong  and was trying to get out of the bed. Patient's father will be here for the day and is comfortable with patient returning to [facility]. Per father patient doesn't want to go to the memory care side of [facility] because "people are in wheel chairs." CSW explained the difference between levels of care including ALF, memory care and SNF. CSW also explained to patient's father that patient is under observation, which means he will likely D/C within 24 to 48 hours. At this point it appears that patient is at baseline to return to [facility]. FL2 completed. CSW and social work Tax inspectorintern will continue to follow up and assist.  CSW attempted to contact [facility] multiple times this morning with no success.   CSW received a call back from [facility] med tech Julieanne MansonLatrica. Per med tech patient is independent with ambulation and on room air at baseline. Per med tech patient has been there for 2 years and his father provides transport. Per med tech patient can return when stable.   Employment status:  Retired Database administratornsurance information:  Managed Medicare PT Recommendations:  Not assessed at this time Information / Referral to community resources:     Patient/Family's Response to care:  Patient's father is agreeable for patient to return to [facility].  Patient/Family's Understanding of and Emotional Response to Diagnosis, Current Treatment, and Prognosis:  Patient's father was pleasant and thanked CSW and social work Tax inspectorintern for their assistance.  Emotional Assessment Appearance:  Appears stated age Attitude/Demeanor/Rapport:  Unable to Assess Affect (typically observed):  Unable to Assess Orientation:  Oriented to Self Alcohol / Substance use:  Not Applicable Psych involvement (Current and /or in the community):  No (Comment)  Discharge Needs  Concerns to be addressed:  Care Coordination, Discharge Planning Concerns Readmission within the last 30 days:  No Current  discharge risk:  Cognitively Impaired Barriers to Discharge:  Continued Medical Work up   Payton SparkAnanda A Hodgson, Student-Social Work 04/12/2017, 9:09 AM

## 2017-04-12 NOTE — Progress Notes (Addendum)
Pt resting with his eyes  Closed. Sitter at bedside. Soft restrains were ordered by MD. Aron Babaestrains were not used. Continue to monitor

## 2017-04-12 NOTE — [place] FL2 (Signed)
[place] MEDICAID FL2 LEVEL OF CARE SCREENING TOOL     IDENTIFICATION  Patient Name: Miguel Tran Birthdate: 01/06/1956 Sex: male Admission Date (Current Location): 04/11/2017  [facility] [place]   Facility and Address:  [facility], [address], [place], [place] [address]      Provider Number: [id number]  Attending Physician Name and Address:  Auburn BilberryPatel, Shreyang, MD  Relative Name and Phone Number:       Current Level of Care: Hospital Recommended Level of Care: Assisted Living Facility Prior Approval Number:    Date Approved/Denied:   PASRR Number:    Discharge Plan: Domiciliary (Rest home)    Current Diagnoses: Patient Active Problem List   Diagnosis Date Noted  . Lethargy 04/11/2017  . 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 02/20/2017    Orientation RESPIRATION BLADDER Height & Weight     Self  Normal Incontinent Weight: 169 lb (76.7 kg) Height:  5\' 8"  (172.7 cm)  BEHAVIORAL SYMPTOMS/MOOD NEUROLOGICAL BOWEL NUTRITION STATUS      Continent Diet(NPO to be advanced)  AMBULATORY STATUS COMMUNICATION OF NEEDS Skin   Limited Assist Verbally Normal                       Personal Care Assistance Level of Assistance  Bathing, Feeding, Dressing Bathing Assistance: Limited assistance Feeding assistance: Independent Dressing Assistance: Limited assistance     Functional Limitations Info  Sight, Hearing, Speech Sight Info: Adequate Hearing Info: Adequate Speech Info: Adequate    SPECIAL CARE FACTORS FREQUENCY  PT (By licensed PT)     PT Frequency: (2-3 Home Health)              Contractures      Additional Factors Info  Code Status, Allergies Code Status Info: (Full Code) Allergies Info: (No Known Allergies)           Current Medications (04/12/2017):  This is the current hospital active medication list Current Facility-Administered Medications  Medication  Dose Route Frequency Provider Last Rate Last Dose  . acetaminophen (TYLENOL) tablet 650 mg  650 mg Oral Q6H PRN Arnaldo Nataliamond, Michael S, MD       Or  . acetaminophen (TYLENOL) suppository 650 mg  650 mg Rectal Q6H PRN Arnaldo Nataliamond, Michael S, MD      . alum & mag hydroxide-simeth (MAALOX/MYLANTA) 200-200-20 MG/5ML suspension 30 mL  30 mL Oral PRN Arnaldo Nataliamond, Michael S, MD      . amLODipine (NORVASC) tablet 10 mg  10 mg Oral Daily Arnaldo Nataliamond, Michael S, MD      . aspirin EC tablet 81 mg  81 mg Oral Daily Arnaldo Nataliamond, Michael S, MD      . docusate sodium (COLACE) capsule 100 mg  100 mg Oral BID Arnaldo Nataliamond, Michael S, MD      . donepezil (ARICEPT) tablet 10 mg  10 mg Oral QHS Arnaldo Nataliamond, Michael S, MD      . enoxaparin (LOVENOX) injection 40 mg  40 mg Subcutaneous Q24H Arnaldo Nataliamond, Michael S, MD   40 mg at 04/11/17 [id number]  . guaifenesin (ROBITUSSIN) 100 MG/5ML syrup 200 mg  200 mg Oral Q6H PRN Arnaldo Nataliamond, Michael S, MD      . haloperidol (HALDOL) 2 MG/ML solution 0.5 mg  0.5 mg Oral BID Arnaldo Nataliamond, Michael S, MD   0.5 mg at 04/11/17 2122  . loperamide (IMODIUM) capsule 2 mg  2 mg Oral PRN Arnaldo Nataliamond, Michael S, MD      .  LORazepam (ATIVAN) tablet 1 mg  1 mg Oral TID Arnaldo Nataliamond, Michael S, MD      . magnesium hydroxide (MILK OF MAGNESIA) suspension 30 mL  30 mL Oral QHS PRN Arnaldo Nataliamond, Michael S, MD      . memantine [facility](NAMENDA) tablet 5 mg  5 mg Oral BID Arnaldo Nataliamond, Michael S, MD      . ondansetron [facility](ZOFRAN) tablet 4 mg  4 mg Oral Q6H PRN Arnaldo Nataliamond, Michael S, MD       Or  . ondansetron [facility](ZOFRAN) injection 4 mg  4 mg Intravenous Q6H PRN Arnaldo Nataliamond, Michael S, MD      . QUEtiapine (SEROQUEL) tablet 100 mg  100 mg Oral QHS Arnaldo Nataliamond, Michael S, MD      . sertraline (ZOLOFT) tablet 50 mg  50 mg Oral Daily Arnaldo Nataliamond, Michael S, MD      . vitamin B-12 (CYANOCOBALAMIN) tablet 1,000 mcg  1,000 mcg Oral Daily Arnaldo Nataliamond, Michael S, MD      . ziprasidone (GEODON) injection 20 mg  20 mg Intramuscular Once Oralia ManisWillis, David, MD         Discharge Medications: Please see  discharge summary for a list of discharge medications.  Relevant Imaging Results:  Relevant Lab Results:   Additional Information    Miguel Tran Annia BeltA Ronel Rodeheaver, Student-Social Work

## 2017-04-12 NOTE — Progress Notes (Signed)
Okey per MD Pyreddy to keep IV site out Continue to monitor

## 2017-04-12 NOTE — Evaluation (Signed)
Clinical/Bedside Swallow Evaluation Patient Details  Name: Miguel OresSamuel Mangual MRN: [id number] Date of Birth: 01/10/1956  Today's Date: 04/12/2017 Time: SLP Start Time (ACUTE ONLY): 0820 SLP Stop Time (ACUTE ONLY): 0920 SLP Time Calculation (min) (ACUTE ONLY): 60 min  Past Medical History:  Past Medical History:  Diagnosis Date  . B12 deficiency   . Early onset Alzheimer's dementia   . HLD (hyperlipidemia)   . HTN (hypertension)    Past Surgical History: History reviewed. No pertinent surgical history. HPI:  The patient with past medical history of early onset dementia w/ behavioral disturbance and hypertension presents to the emergency department via EMS from his nursing home [facility] due to elevated blood pressure.  This alarmed the assisted living staff as the patient had some jerking or fasciculation of his left hand while tilting his head to the right.  He has been less able to feed himself over the last few days per staff report.  Initially, he has been essentially nonverbal, but currently, he responds verbally moreso when agitated(cursing included in responses).  MRI revealed no acute intracranial abnormalities; Chronic biparietal and mesial temporal lobe volume loss noted in 2017 as well.   Assessment / Plan / Recommendation Clinical Impression  Pt appears to present w/ adequate oropharyngeal swallow function w/ decreased risk for aspiration when following aspiration precautions. Pt has Cognitive decline d/t early onset Dementia per chart review and this can increase risk for choking and aspiration events d/t reduced attention during tasks of eating/drinking. Pt was somewhat agitated and confused during evaluation but participated w/ gentle guidance. Sitter present during evaluation.  Pt was given trials of thin liquid via cup and Dysphagia level 1/2 foods via spoon. Pt's oral phase appeared grossly [facility] c/b  adequate labial seal, awareness/control of bolus once taken into mouth, timely  A-P transit, rotary mastication pattern, and adequate oral clearing. No overt, immediate s/s of aspiration were noted. No decline in vocal quality or respiratory status were noted. Pt required max assistance during po's. Pt did best when ST spoke in softer voice/lower volume w/ minimal words. Pt also did best w/ more tactile cues and less verbal cues - allowing pt to have tactile stimulation of the bolus and providing him the opportunity to hold the cup to drink (which he did). ST educated pt's Sitter and NSG on strategies to assist pt when eating, behaviors during engagement w/ pt, and creating a condusive environment when eating/drinking.  D/t pt's Cognitive decline, recommend not feeding pt when pt is too agitated/drowsy d/t increasing risk of choking hazard and providing guidance for initiation of tasks allowing pt to participate. D/t pt's presentation and Cognitive decline, recommend Dysphagia level 3 diet w/ thin liquids; aspiration precautions - monitoring w/ ALL oral intake, sitting upright during meals, reducing distractions, and small bites/sips. Recommend meds crushed in applesauce for easier, safer swallowing at this time. Recommend total assist during meals and tactile cues w/ low volume and minimal words/conversation/commands. Recommend any further Cognitive assessment/treatment post discharge when acuity of illness has subsided and pt can be engaged - unsure of pt's baseline Cognitive-Communication status.  ST services will f/u w/ pt status, caregiver education, and diet toleration while pt admitted as needed. NSG/MD updated.       Aspiration Risk  (decreased risk following aspiration precautions) and when fully alert but calm, not agitated   Diet Recommendation   Dysphagia level 3 w/ thin liquids; aspiration precautions; monitoring at all meals.   Medication Administration: Crushed with puree(as needed) at this  time   Other  Recommendations Oral Care Recommendations: Oral care  BID;Staff/trained caregiver to provide oral care   Follow up Recommendations (TBD); dysphagia education     Frequency and Duration min 2x/week  1 week       Prognosis Prognosis for Safe Diet Advancement: Good Barriers to Reach Goals: Cognitive deficits      Swallow Study   General Date of Onset: 04/11/17 HPI: The patient with past medical history of early onset dementia and hypertension presents to the emergency department via EMS from his nursing home due to elevated blood pressure.  This alarmed the assisted living staff as the patient had some jerking or fasciculation of his left hand while tilting his head to the right.  He has been less able to feed himself over the last few days per staff report.  This evening he has been essentially nonverbal.  CT of the patient's head does not show acute infarct.  However the patient's rapid change in neurologic status suggest possible stroke which prompted the emergency department staff to call the hospitalist service for admission. Type of Study: Bedside Swallow Evaluation Previous Swallow Assessment: none reported Diet Prior to this Study: NPO Temperature Spikes Noted: No(wbc WNL) Respiratory Status: Room air History of Recent Intubation: No Behavior/Cognition: Alert;Confused;Agitated;Impulsive;Distractible;Requires cueing(tactile cues) Oral Cavity Assessment: Within Functional Limits Oral Care Completed by SLP: Recent completion by staff Oral Cavity - Dentition: Adequate natural dentition Vision: Functional for self-feeding Self-Feeding Abilities: Total assist(at this time) Patient Positioning: Upright in bed Baseline Vocal Quality: Normal Volitional Cough: Strong Volitional Swallow: Able to elicit    Oral/Motor/Sensory Function Overall Oral Motor/Sensory Function: Within functional limits   Ice Chips Ice chips: Not tested   Thin Liquid Thin Liquid: Within functional limits(6 oz) Presentation: Straw    Nectar Thick Nectar Thick  Liquid: Not tested   Honey Thick Honey Thick Liquid: Not tested   Puree Puree: Within functional limits Presentation: Spoon(6 trials)   Solid   GO    Solid: Not tested       Nancy Fetterarson Barbee, SLP-Graduate Student Nancy Fetterarson Barbee 04/12/2017,11:19 AM   The information in this patient note, response to treatment, and overall treatment plan developed has been reviewed and agreed upon by this clinician.  Jerilynn SomKatherine Watson, MS, CCC-SLP [phone] 04/12/17,11:52 AM

## 2017-04-12 NOTE — Progress Notes (Signed)
Spoke with MD ok to discontinue NIH

## 2017-04-12 NOTE — Progress Notes (Signed)
Pt very impulsive and agitated, ativan 1 mg iv placed by MD. Continue to monitor

## 2017-04-12 NOTE — Evaluation (Signed)
Occupational Therapy Evaluation Patient Details Name: Miguel Tran MRN: [id number] DOB: 1955/10/13 Today's Date: 04/12/2017    History of Present Illness 61yo male pt with PMHx of early onset dementia with behavioral disturbances and HTN presented to the ED via EMS on 11/7 from [facility] due to elevated blood pressure.  This alarmed the assisted living staff as the patient had some jerking or fasciculation of his left hand while tilting his head to the right.  He has been less able to feed himself over the last few days per staff report. Neuro work up for CVA negative.    Clinical Impression   Pt seen for OT evaluation this date. Co-eval with PT for pt/therapist safety following SLP evaluation. Pt alert, oriented to self, agitated, asking where his father is. Able to tell therapist his name and father's name. Pt restless, unable to follow simple commands, does respond better with tactile > verbal cues. Sitter in room throughout assessment. During attempts at bed mobility, pt required mod-max x2 for safety/assist with tactile cues and min verbal cues to sit EOB with min assist x2 to maintain sitting balance, as pt attempting to lay back down. Pt became combative with therapists x2 during session, hitting, kicking. Total assist for basic ADL tasks at this time due to agitation, impaired cognition. Strength/ROM appears to be [facility]. Unsafe to attempt additional mobility at this time. Pt will likely require higher level of care moving forward. Suspect pt may perform better in familiar environment or with familiar people around him. Will benefit from skilled OT services to address impairments in cognition, safety, possible AE/DME, and caregiver education in order to maximize functional participation in ADL tasks, minimize falls risk, and maximize quality of life.     Follow Up Recommendations  Supervision/Assistance - 24 hour;Other (comment)(consider LTC memory unit for increased assist for mobility/ADL)     Equipment Recommendations  None recommended by OT    Recommendations for Other Services       Precautions / Restrictions Precautions Precautions: Fall;Other (comment) Precaution Comments: combative Restrictions Weight Bearing Restrictions: No      Mobility Bed Mobility Overal bed mobility: Needs Assistance Bed Mobility: Supine to Sit     Supine to sit: +2 for safety/equipment;+2 for physical assistance;Mod assist;HOB elevated;Max assist     General bed mobility comments: min verbal, max tactile cues to attempt with mod-max assist for both BLE mgt and trunk support with pt generally keeping his BUE against his torso with clenched fists  Transfers                 General transfer comment: unsafe to attempt at this time    Balance Overall balance assessment: Needs assistance Sitting-balance support: No upper extremity supported;Feet unsupported Sitting balance-Leahy Scale: Poor Sitting balance - Comments: pt requires min assist x2 for trunk support to maintain sitting balance as pt attempts to lay back down Postural control: Posterior lean     Standing balance comment: unsafe to attempt                           ADL either performed or assessed with clinical judgement   ADL Overall ADL's : Needs assistance/impaired Eating/Feeding: Total assistance   Grooming: Bed level;Set up Grooming Details (indicate cue type and reason): spontaneously will bring napkin/washcloth to mouth, unable to perform with cues/on command Upper Body Bathing: Bed level;Total assistance;Maximal assistance   Lower Body Bathing: Total assistance;Bed level   Upper Body Dressing :  Maximal assistance;Bed level   Lower Body Dressing: Total assistance;Bed level Lower Body Dressing Details (indicate cue type and reason): with tactile/verbal cues and assist to bring foot off bed, pt required total assist for donning socks   Toilet Transfer Details (indicate cue type and reason):  unsafe to attempt         Functional mobility during ADLs: (unsafe to attempt) General ADL Comments: pt unable to follow commands, agitated, occasionally becoming verbally or physically aggressive with therapists     Vision Baseline Vision/History: (unable to determine due to pt's cognition, but likely with visual deficits due to dementia) Patient Visual Report: (unable to determine) Additional Comments: unable to determine due to pt's cognition, although visual deficits are likely given dementia     Perception     Praxis      Pertinent Vitals/Pain Pain Assessment: Faces Faces Pain Scale: No hurt     Hand Dominance     Extremity/Trunk Assessment Upper Extremity Assessment Upper Extremity Assessment: Difficult to assess due to impaired cognition;Overall [facility] for tasks assessed(no tremor noted, pt using BUE to fidget with blanket and hospital gown, tends to keep hands to himself but impulsively reaches out attempts to swat at therapist)   Lower Extremity Assessment Lower Extremity Assessment: Difficult to assess due to impaired cognition;Defer to PT evaluation(pt able to raise BLE off bed with gentle tactile/verbal cues, assist required to attempt to sit EOB)   Cervical / Trunk Assessment Cervical / Trunk Assessment: Normal   Communication Communication Communication: Receptive difficulties;Expressive difficulties(dementia)   Cognition Arousal/Alertness: Awake/alert Behavior During Therapy: Restless;Agitated;Impulsive Overall Cognitive Status: History of cognitive impairments - at baseline                                 General Comments: Pt alert and oriented to self. Unable to follow simple commands. Requires tactile cues. Pt agitated, combative at times   General Comments       Exercises     Shoulder Instructions      Home Living Family/patient expects to be discharged to:: Assisted living                             Home Equipment:  None          Prior Functioning/Environment Level of Independence: Independent        Comments: Per chart review, pt was independent with ambulation using no AD per chart review at ALF, would walk all night and sleep all day. Assist for dressing, (likely assist for bathing),and increased assist for self feeding over past couple days before admission.         OT Problem List: Decreased cognition;Decreased safety awareness;Impaired balance (sitting and/or standing)      OT Treatment/Interventions: Self-care/ADL training;Patient/family education    OT Goals(Current goals can be found in the care plan section) Acute Rehab OT Goals OT Goal Formulation: Patient unable to participate in goal setting  OT Frequency: Min 1X/week   Barriers to D/C:            Co-evaluation PT/OT/SLP Co-Evaluation/Treatment: Yes Reason for Co-Treatment: Necessary to address cognition/behavior during functional activity;For patient/therapist safety;To address functional/ADL transfers   OT goals addressed during session: ADL's and self-care      AM-PAC PT "6 Clicks" Daily Activity     Outcome Measure Help from another person eating meals?: Total Help from another person taking care  of personal grooming?: A Lot Help from another person toileting, which includes using toliet, bedpan, or urinal?: Total Help from another person bathing (including washing, rinsing, drying)?: Total Help from another person to put on and taking off regular upper body clothing?: A Lot Help from another person to put on and taking off regular lower body clothing?: Total 6 Click Score: 8   End of Session Nurse Communication: Other (comment);Mobility status(notified of completion of OT/PT evals)  Activity Tolerance: Treatment limited secondary to agitation Patient left: in bed;with call bell/phone within reach;with bed alarm set;with nursing/sitter in room  OT Visit Diagnosis: Other abnormalities of gait and mobility  (R26.89);Other symptoms and signs involving cognitive function;Cognitive communication deficit (R41.841) Symptoms and signs involving cognitive functions: Other cerebrovascular disease                Time: 0920-0934 OT Time Calculation (min): 14 min Charges:  OT General Charges $OT Visit: 1 Visit OT Evaluation $OT Eval Low Complexity: 1 Low G-Codes: OT G-codes **NOT FOR INPATIENT CLASS** Functional Assessment Tool Used: AM-PAC 6 Clicks Daily Activity;Clinical judgement Functional Limitation: Self care Self Care Current Status (W2956(G8987): At least 80 percent but less than 100 percent impaired, limited or restricted Self Care Goal Status (O1308(G8988): At least 60 percent but less than 80 percent impaired, limited or restricted   Richrd PrimeJamie Stiller, MPH, MS, OTR/L ascom [phone] 04/12/17, 10:10 AM

## 2017-04-12 NOTE — Progress Notes (Signed)
OT Cancellation Note  Patient Details Name: Miguel Tran MRN: [id number] DOB: 04/30/1956   Cancelled Treatment:    Reason Eval/Treat Not Completed: Other (comment). Order received, chart reviewed. Upon initial attempt, pt alert, oriented to self, decreased safety awareness, attempting to get out of bed with sitter in room. Sitter noted that pt has been attempting to kick staff if too close. Spoke with RN and PT. Will re-attempt co-evaluation with PT for pt safety in order to attempt mobility and functional tasks.   Richrd PrimeJamie Stiller, MPH, MS, OTR/L ascom [phone] 04/12/17, 8:57 AM

## 2017-04-12 NOTE — Progress Notes (Addendum)
Pt with increased agitation.  Attempting to hit and kick aids when they were changing him.  Pt started yelling and swearing at staff.  Gave geodon IM with no effect.  Gave oral Halodal  0.5.  No change.  Called [facility] doc and got order for IM Haldol 5 mg.  There are 2 officers and two aids holding arms and legs so pt will not get out of bed or hit or kick.  Awaiting pt to relax with multiple attempts to soothe patient and gently hold limbs.  Turned down lights and tv. Henriette CombsSarah Klye Besecker RN 2011  Pt still kicking, yelling, swearing at staff.  Attended by 4 staff members at this time. Henriette CombsSarah Demari Gales RN [phone] Nursing supervisor called and came to room.  Aids and officers able to leave room and pt was calmed down and put in a recumbent position with frequent attempts to get out of bed but able to be redirected with oral ques. 2133 Rechecked vitals and pt stable with BP 135/85, HR 91, Resp 19, SAO2 97 % on room air. Pt awake, responds to questions, relaxed at this time. Henriette CombsSarah Delvonte Berenson  2149  Pt given meds crushed in ice cream.  Pt ate 100% of ice cream container.  Given water to drink, repositioned in bed and able to fall asleep easilty. Sitter present in room. Henriette CombsSarah Marianita Botkin RN

## 2017-04-12 NOTE — Evaluation (Signed)
Physical Therapy Evaluation Patient Details Name: Miguel Tran MRN: [id number] DOB: 11/14/1955 Today's Date: 04/12/2017   History of Present Illness  61 yo male pt with PMHx of early onset dementia with behavioral disturbances and HTN presented to the ED via EMS on 11/7 from [facility] due to elevated blood pressure.  This alarmed the assisted living staff as the patient had some jerking or fasciculation of his left hand while tilting his head to the right.  He has been less able to feed himself over the last few days per staff report. Neuro work up for CVA negative.   Clinical Impression  Pt seen for co-evaluation with OT for pt/therapist safety (sitter also in pt's room during evaluation).  Pt had just finished with SLP evaluation and was asking where his father was.  Pt unable to follow simple commands and did better with tactile cues.  Pt assisted into sitting but pt trying to lay back down limiting session's activities.  Pt combative during session 2x's hitting and then kicking (1x sitting on edge of bed and 1x after laying back down).  Unsafe to attempt further mobility at this time.  Pt appears very strong and anticipate pt would do much better in a familiar environment with familiar staff.  Per chart review pt appears to have been ambulating independently.  D/t decreased mobility concerns, will monitor and trial pt with PT during hospital stay (pending pt's ability to safely participate).    Follow Up Recommendations Supervision/Assistance - 24 hour    Equipment Recommendations  Other (comment)(TBD)    Recommendations for Other Services       Precautions / Restrictions Precautions Precautions: Fall;Other (comment) Precaution Comments: combative Restrictions Weight Bearing Restrictions: No      Mobility  Bed Mobility Overal bed mobility: Needs Assistance Bed Mobility: Supine to Sit     Supine to sit: +2 for safety/equipment;+2 for physical assistance;Mod assist;HOB  elevated;Max assist     General bed mobility comments: assist for trunk and B LE management supine to/from sit; min vc's and max tactile cues for bed mobility; pt kept B UE/hands against his torso  Transfers                 General transfer comment: unsafe to attempt at this time  Ambulation/Gait         Gait velocity: unsafe to attempt at this time      Stairs            Wheelchair Mobility    Modified Rankin (Stroke Patients Only)       Balance Overall balance assessment: Needs assistance Sitting-balance support: No upper extremity supported;Feet unsupported Sitting balance-Leahy Scale: Poor Sitting balance - Comments: pt requires min assist x2 for trunk support to maintain sitting balance (pt intermittently attempting to lay back down) Postural control: Posterior lean     Standing balance comment: unsafe to attempt                             Pertinent Vitals/Pain Pain Assessment: Faces Faces Pain Scale: No hurt Pain Intervention(s): Monitored during session    Home Living Family/patient expects to be discharged to:: Assisted living               Home Equipment: None Additional Comments: [facility] per chart review    Prior Function Level of Independence: Independent         Comments: Per chart review, pt was independent  with ambulation using no AD at ALF, would walk all night and sleep all day. Assist for dressing, (likely assist for bathing),and increased assist for self feeding over past couple days before admission.      Hand Dominance        Extremity/Trunk Assessment   Upper Extremity Assessment Upper Extremity Assessment: Defer to OT evaluation    Lower Extremity Assessment Lower Extremity Assessment: Difficult to assess due to impaired cognition(Pt able to lift B LE's off of bed with gentle tactile/vc's)    Cervical / Trunk Assessment Cervical / Trunk Assessment: Normal  Communication    Communication: Receptive difficulties;Expressive difficulties(PMH of dementia)  Cognition Arousal/Alertness: Awake/alert Behavior During Therapy: Restless;Agitated;Impulsive Overall Cognitive Status: No family/caregiver present to determine baseline cognitive functioning(Pt did not answer therapists questions to fully assess cognitive status)                                 General Comments: Pt alert and oriented to self. Unable to follow simple commands. Requires tactile cues. Pt agitated, combative at times      General Comments      Exercises     Assessment/Plan    PT Assessment Patient needs continued PT services  PT Problem List Decreased mobility;Decreased balance;Decreased knowledge of use of DME;Decreased knowledge of precautions       PT Treatment Interventions DME instruction;Gait training;Functional mobility training;Therapeutic activities;Therapeutic exercise;Balance training;Patient/family education    PT Goals (Current goals can be found in the Care Plan section)  Acute Rehab PT Goals Patient Stated Goal: to go home PT Goal Formulation: With patient Time For Goal Achievement: 04/26/17 Potential to Achieve Goals: Good    Frequency Min 2X/week(Trial PT)   Barriers to discharge        Co-evaluation   Reason for Co-Treatment: Necessary to address cognition/behavior during functional activity;For patient/therapist safety;To address functional/ADL transfers   OT goals addressed during session: ADL's and self-care       AM-PAC PT "6 Clicks" Daily Activity  Outcome Measure Difficulty turning over in bed (including adjusting bedclothes, sheets and blankets)?: Unable Difficulty moving from lying on back to sitting on the side of the bed? : Unable Difficulty sitting down on and standing up from a chair with arms (e.g., wheelchair, bedside commode, etc,.)?: Unable Help needed moving to and from a bed to chair (including a wheelchair)?: A Little Help  needed walking in hospital room?: A Little Help needed climbing 3-5 steps with a railing? : A Lot 6 Click Score: 11    End of Session   Activity Tolerance: Treatment limited secondary to agitation Patient left: in bed;with call bell/phone within reach;with bed alarm set;with nursing/sitter in room Nurse Communication: Mobility status;Precautions PT Visit Diagnosis: Other abnormalities of gait and mobility (R26.89)    Time: [id number] PT Time Calculation (min) (ACUTE ONLY): 14 min   Charges:   PT Evaluation $PT Eval Low Complexity: 1 Low     PT G Codes:   PT G-Codes **NOT FOR INPATIENT CLASS** Functional Assessment Tool Used: AM-PAC 6 Clicks Basic Mobility Functional Limitation: Mobility: Walking and moving around Mobility: Walking and Moving Around Current Status ([id number](G8978): At least 60 percent but less than 80 percent impaired, limited or restricted Mobility: Walking and Moving Around Goal Status [id number](G8979): At least 1 percent but less than 20 percent impaired, limited or restricted    Hendricks Limesmily Arshi Duarte, PT 04/12/17, 1:48 PM [phone]

## 2017-04-12 NOTE — Progress Notes (Signed)
Subjective: Patient more alert today.  Has been combative.    Objective: Current vital signs: BP (!) 120/96 (BP Location: Left Arm)   Pulse 73   Temp 98.7 F (37.1 C) (Axillary)   Resp 18   Ht 5\' 8"  (1.727 m)   Wt 76.7 kg (169 lb)   SpO2 94%   BMI 25.70 kg/m  Vital signs in last 24 hours: Temp:  [97.5 F (36.4 C)-98.7 F (37.1 C)] 98.7 F (37.1 C) (11/08 0516) Pulse Rate:  [53-90] 73 (11/08 0516) Resp:  [12-20] 18 (11/08 0516) BP: (107-141)/(75-104) 120/96 (11/08 0516) SpO2:  [94 %-100 %] 94 % (11/08 0516) Weight:  [76.7 kg (169 lb)-81.3 kg (179 lb 4.8 oz)] 76.7 kg (169 lb) (11/08 0516)  Intake/Output from previous day: 11/07 0701 - 11/08 0700 In: 1000 [I.V.:1000] Out: -  Intake/Output this shift: No intake/output data recorded. Nutritional status: DIET DYS 3 Room service appropriate? Yes with Assist; Fluid consistency: Thin  Neurologic Exam: Mental Status: Alert.  Speech fluent.  Follows commands but at times will perform a command using the opposite extremity despite tactile direction.   Cranial Nerves: II: Discs flat bilaterally; Visual fields grossly normal, pupils equal, round, reactive to light and accommodation III,IV, VI: ptosis not present, extra-ocular motions intact bilaterally V,VII: mild right facial droop, facial light touch sensation normal bilaterally VIII: hearing normal bilaterally IX,X: gag reflex present XI: bilateral shoulder shrug XII: midline tongue extension Motor: 5/5 throughout Sensory: Pinprick and light touch intact throughout, bilaterally    Lab Results: Basic Metabolic Panel: Recent Labs  Lab 04/11/17 0228  NA 138  K 3.7  CL 105  CO2 24  GLUCOSE 115*  BUN 17  CREATININE 1.17  CALCIUM 9.5    Liver Function Tests: Recent Labs  Lab 04/11/17 0228  AST 19  ALT 16*  ALKPHOS 57  BILITOT 1.6*  PROT 7.5  ALBUMIN 4.6   No results for input(s): LIPASE, AMYLASE in the last 168 hours. No results for input(s): AMMONIA in  the last 168 hours.  CBC: Recent Labs  Lab 04/11/17 0228  WBC 10.2  NEUTROABS 7.8*  HGB 15.7  HCT 45.8  MCV 91.8  PLT 192    Cardiac Enzymes: Recent Labs  Lab 04/11/17 0228  TROPONINI <0.03    Lipid Panel: Recent Labs  Lab 04/12/17 0502  CHOL 198  TRIG 129  HDL 47  CHOLHDL 4.2  VLDL 26  LDLCALC 956125*    CBG: Recent Labs  Lab 04/11/17 0359  GLUCAP 95    Microbiology: Results for orders placed or performed during the hospital encounter of 04/11/17  MRSA PCR Screening     Status: None   Collection Time: 04/12/17  2:49 AM  Result Value Ref Range Status   MRSA by PCR NEGATIVE NEGATIVE Final    Comment:        The GeneXpert MRSA Assay (FDA approved for NASAL specimens only), is one component of a comprehensive MRSA colonization surveillance program. It is not intended to diagnose MRSA infection nor to guide or monitor treatment for MRSA infections.     Coagulation Studies: Recent Labs    04/11/17 0303  LABPROT 13.9  INR 1.08    Imaging: Ct Head Wo Contrast  Result Date: 04/11/2017 CLINICAL DATA:  Altered level of consciousness EXAM: CT HEAD WITHOUT CONTRAST TECHNIQUE: Contiguous axial images were obtained from the base of the skull through the vertex without intravenous contrast. COMPARISON:  02/20/2017 FINDINGS: Brain: No acute territorial infarction, hemorrhage, or  intracranial mass is visualized. Mild to moderate atrophy. Stable ventricle size. Vascular: No hyperdense vessels.  Carotid artery calcification. Skull: Normal. Negative for fracture or focal lesion. Sinuses/Orbits: Mild mucosal thickening in the ethmoid sinuses. No acute orbital abnormality. Other: None IMPRESSION: No CT evidence for acute intracranial abnormality.  Atrophy Electronically Signed   By: Jasmine PangKim  Fujinaga M.D.   On: 04/11/2017 03:02   Mr Brain Wo Contrast  Result Date: 04/11/2017 CLINICAL DATA:  61 year old male with altered mental status. Dementia diagnosed in 2015.  Hypertensive and new tremor of the left upper extremity. EXAM: MRI HEAD WITHOUT CONTRAST TECHNIQUE: Multiplanar, multiecho pulse sequences of the brain and surrounding structures were obtained without intravenous contrast. COMPARISON:  Head CT 0245 hours today.  Brain MRI 06/09/2015. FINDINGS: Brain: No restricted diffusion to suggest acute infarction. No midline shift, mass effect, evidence of mass lesion, ventriculomegaly, extra-axial collection or acute intracranial hemorrhage. Cervicomedullary junction and pituitary are within normal limits. Cerebral volume appears stable since 2017. Biparietal volume loss. Mesial temporal lobe volume loss. Wallace CullensGray and white matter signal appears stable and is largely normal for age. No chronic cerebral blood products. Negative deep gray matter nuclei, brainstem and cerebellum. Vascular: Major intracranial vascular flow voids are stable. Skull and upper cervical spine: Negative. Visualized bone marrow signal is within normal limits. Sinuses/Orbits: Stable and negative. Other: Visible internal auditory structures appear normal. Mastoid air cells remain clear. Negative scalp and face soft tissues. IMPRESSION: 1.  No acute intracranial abnormality. 2. Stable noncontrast MRI appearance of the brain since 2017. Chronic Biparietal and mesial temporal lobe volume loss. Electronically Signed   By: Odessa FlemingH  Hall M.D.   On: 04/11/2017 17:38   [place] Carotid Bilateral (at [facility] Only)  Result Date: 04/11/2017 CLINICAL DATA:  Stroke.  History of hypertension and hyperlipidemia. EXAM: BILATERAL CAROTID DUPLEX ULTRASOUND TECHNIQUE: Wallace CullensGray scale imaging, color Doppler and duplex ultrasound were performed of bilateral carotid and vertebral arteries in the neck. COMPARISON:  None. FINDINGS: Criteria: Quantification of carotid stenosis is based on velocity parameters that correlate the residual internal carotid diameter with NASCET-based stenosis levels, using the diameter of the distal internal  carotid lumen as the denominator for stenosis measurement. The following velocity measurements were obtained: RIGHT ICA:  67/17 cm/sec CCA:  77/14 cm/sec SYSTOLIC ICA/CCA RATIO:  0.9 DIASTOLIC ICA/CCA RATIO:  1.2 ECA:  73 cm/sec LEFT ICA:  55/18 cm/sec CCA:  82/17 cm/sec SYSTOLIC ICA/CCA RATIO:  0.7 DIASTOLIC ICA/CCA RATIO:  1.1 ECA:  84 cm/sec RIGHT CAROTID ARTERY: There is no grayscale evidence of significant intimal thickening or atherosclerotic plaque affecting interrogated portions of the right carotid system. There are no elevated peak systolic velocities within the interrogated course the right internal carotid artery to suggest a hemodynamically significant stenosis. RIGHT VERTEBRAL ARTERY:  Antegrade Flow LEFT CAROTID ARTERY: There is a minimal amount of intimal thickening/atherosclerotic plaque within the left carotid bulb (image 52), extending to involve the origin and proximal aspects of the left internal carotid artery (image 62), not resulting in elevated peak systolic velocities within the interrogated course the left internal carotid artery to suggest a hemodynamically significant stenosis. LEFT VERTEBRAL ARTERY:  Antegrade flow IMPRESSION: 1. Minimal amount of left-sided atherosclerotic plaque, not resulting in a hemodynamically significant stenosis. 2. Normal sonographic evaluation of the right carotid system. Electronically Signed   By: Simonne ComeJohn  Watts M.D.   On: 04/11/2017 15:11    Medications:  I have reviewed the patient's current medications. Scheduled: . amLODipine  10 mg Oral Daily  .  aspirin EC  81 mg Oral Daily  . docusate sodium  100 mg Oral BID  . donepezil  10 mg Oral QHS  . enoxaparin (LOVENOX) injection  40 mg Subcutaneous Q24H  . haloperidol  0.5 mg Oral BID  . LORazepam  1 mg Oral TID  . memantine  5 mg Oral BID  . QUEtiapine  100 mg Oral QHS  . sertraline  50 mg Oral Daily  . vitamin B-12  1,000 mcg Oral Daily  . ziprasidone  20 mg Intramuscular Once     Assessment/Plan: Mental status improved.  Patient more combative than at baseline but I suspect this is due to the change in environment.  EEG unremarkable.  MRI of the brain reviewed and only significant for atrophy.  B12 1918.  Presentation likely related to progressive dementia.   No further neurologic intervention is recommended at this time.  If further questions arise, please call or page at that time.  Thank you for allowing neurology to participate in the care of this patient.    LOS: 0 days   Thana Farr, MD Neurology [phone] 04/12/2017  1:07 PM

## 2017-04-13 DIAGNOSIS — G300 Alzheimer's disease with early onset: Secondary | ICD-10-CM

## 2017-04-13 LAB — AMMONIA: AMMONIA: 9 umol/L (ref 9–35)

## 2017-04-13 NOTE — Care Management (Signed)
Notified Anibal Hendersonim Henderson, RN, representative [facility] that Mr. Shea EvansDunn will need services at [facility]. Spoke with Dr. Allena KatzPatel. Discharge 04/14/17 Gwenette GreetBrenda S Ronita Hargreaves RN MSN CCM Care management [phone]

## 2017-04-13 NOTE — Progress Notes (Signed)
Physical Therapy Treatment Patient Details Name: Miguel OresSamuel Tran MRN: [id number] DOB: 07/05/1955 Today's Date: 04/13/2017    History of Present Illness 61 yo male pt with PMHx of early onset dementia with behavioral disturbances and HTN presented to the ED via EMS on 11/7 from [facility] due to elevated blood pressure.  This alarmed the assisted living staff as the patient had some jerking or fasciculation of his left hand while tilting his head to the right.  He has been less able to feed himself over the last few days per staff report. Neuro work up for CVA negative.     PT Comments    Pt remains lethargic during PT session, but awake enough to follow basic instructions and ultimately to walk around the nurses station with heavy cuing but no significant physical assist.  He showed no signs of aggression and though he was not quite at his baseline (likely a medication factor) his parents agree that he should be able to return back to his ALF and just need a little extra supervision for a few days.  Discussed with both of them is they thought he would respond well with HHPT and make functional gains, and ultimately we decided that a return to his normal setting and familiar people would best serve him at this time.    Follow Up Recommendations  Supervision/Assistance - 24 hour     Equipment Recommendations  None recommended by PT    Recommendations for Other Services       Precautions / Restrictions Precautions Precautions: Fall Restrictions Weight Bearing Restrictions: No    Mobility  Bed Mobility Overal bed mobility: Needs Assistance Bed Mobility: Supine to Sit     Supine to sit: Min assist     General bed mobility comments: Pt made effort to get to EOB, but ultimately needed hand to hold to get up to sitting  Transfers Overall transfer level: Needs assistance Equipment used: Rolling walker (2 wheeled);None;4-wheeled walker Transfers: Sit to/from Stand Sit to Stand: Min  assist         General transfer comment: Pt nearly able to get to standing w/o assist, needed minimal help to shift hips forward to get to upright  Ambulation/Gait Ambulation/Gait assistance: Min guard Ambulation Distance (Feet): 200 Feet Assistive device: Rolling walker (2 wheeled);None       General Gait Details: Pt with slow, shuffling and at time inconsistent gait, but ultimately he was able to walk ~100 ft with walker and ~100 ft w/o walker.  He needed a lot of redirection and cuing, but did not have any overt LOBs or significant safety issues.    Stairs            Wheelchair Mobility    Modified Rankin (Stroke Patients Only)       Balance Overall balance assessment: Needs assistance   Sitting balance-Leahy Scale: Good       Standing balance-Leahy Scale: Fair Standing balance comment: Pt was able to maintain standing with CGA                            Cognition Arousal/Alertness: Lethargic Behavior During Therapy: Flat affect Overall Cognitive Status: Impaired/Different from baseline                                 General Comments: Pt still somewhat medicated, but able to follow basic instructions.  Family  reports he generally does have flat affect and minimal interaction      Exercises      General Comments        Pertinent Vitals/Pain Pain Assessment: No/denies pain    Home Living Family/patient expects to be discharged to:: Assisted living             Home Equipment: None Additional Comments: [facility]    Prior Function Level of Independence: Independent(regularly needs redirection/assistance)          PT Goals (current goals can now be found in the care plan section) Progress towards PT goals: Progressing toward goals    Frequency    Min 2X/week      PT Plan Current plan remains appropriate    Co-evaluation              AM-PAC PT "6 Clicks" Daily Activity  Outcome Measure   Difficulty turning over in bed (including adjusting bedclothes, sheets and blankets)?: A Lot Difficulty moving from lying on back to sitting on the side of the bed? : Unable Difficulty sitting down on and standing up from a chair with arms (e.g., wheelchair, bedside commode, etc,.)?: Unable Help needed moving to and from a bed to chair (including a wheelchair)?: A Little Help needed walking in hospital room?: A Little Help needed climbing 3-5 steps with a railing? : A Lot 6 Click Score: 12    End of Session Equipment Utilized During Treatment: Gait belt Activity Tolerance: Patient limited by lethargy Patient left: with call bell/phone within reach;with bed alarm set;with nursing/sitter in room   PT Visit Diagnosis: Other abnormalities of gait and mobility (R26.89)     Time: [id number] PT Time Calculation (min) (ACUTE ONLY): 25 min  Charges:  $Gait Training: 8-22 mins $Therapeutic Activity: 8-22 mins                    G Codes:       Malachi ProGalen R Cosimo Schertzer, DPT 04/13/2017, 4:38 PM

## 2017-04-13 NOTE — Plan of Care (Signed)
Pt with long episode of aggression, agitation and confusion this evening after pt's father left and aid attempted to change pt's wet brief.  Pt given haldol and Geodon, calming measures used and pt finally was able to fall asleep. CVA appears to be ruled out so CVA care plan resolved. All education was given to pt's father.

## 2017-04-13 NOTE — Progress Notes (Signed)
Speech Therapy Note: reviewed chart notes; consulted NSG re: pt's status today. NSG denied any swallowing difficulties w/ po's, but pt did require min more assistance when sleepy. Pt is lethargic currently d/t agitation and medications but NSG stated he was fully awake this morning eating the breakfast meal w/ NSG support. Pt does have baseline Dementia(significant atrophy per MRI) and frequently agitated/agressive.   Recommended continue w/ current diet(mech soft) for easier mastication of soft foods; aspiration precautions; Pills in Puree as needed; monitoring at meals; assistance at meals; give po's only when fully awake and reduce distractions in environment/room during meals. When pt is able to discharge and achieve a more stable, alert presentation in his environment, recommend f/u for cognitive-linguistic evaluation if it is felt that his communication has changed from his baseline communication abilities, or if any education is warranted. NSG to reconsult if any change in status while admitted. NSG agreed.    Miguel SomKatherine Watson, MS, CCC-SLP

## 2017-04-13 NOTE — Progress Notes (Signed)
PT Cancellation Note  Patient Details Name: Miguel Tran MRN: [id number] DOB: 02/14/1956   Cancelled Treatment:    Reason Eval/Treat Not Completed: Patient's level of consciousness Attempted to see pt this AM at the prescribed time with family present.  He was soundly asleep (parents had not been able to wake him).  Nursing to call back when he is awake and we will attempt to see him then.    Malachi ProGalen R Neils Siracusa, DPT 04/13/2017, 11:39 AM

## 2017-04-13 NOTE — Consult Note (Signed)
[facility] Face-to-Face Psychiatry Consult   Reason for Consult: Consult 61 year old man with early onset dementia.  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 Lethargy [R53.83] 04/11/2017  . 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 male patient admitted with patient not able to give information.  Information obtained from his parents.  They report that he came to the hospital with high blood pressure.[name]  HPI: This is a 61 year old man with early onset dementia with agitation.  He was admitted from [facility] with high blood pressure and change in mental status.  Reportedly he was quite agitated yesterday.  Parents say that yesterday he was trying to crawl out of bed very agitated seemed very uncomfortable.  Today he says he has had a much better day.  Patient has managed to eat 2 meals today.  Has not been striking out or agitated.  He is sleeping quietly now.  Social history: 61 year old man with early onset dementia currently at [facility].  Parents are his guardians and are watching out for him.  Medical history: Early onset dementia unclear cause unclear if any other dementing illness is involved.  Has been rapidly progressing.  Substance abuse history none  Past Psychiatric History: History of early onset dementia with intermittent agitation that has responded to appropriate modest doses of antipsychotics  Risk to Self: Is patient at risk for suicide?: No Risk to Others:   Prior Inpatient Therapy:   Prior Outpatient Therapy:    Past Medical History:  Past Medical History:  Diagnosis Date  . B12 deficiency    . Early onset Alzheimer's dementia   . HLD (hyperlipidemia)   . HTN (hypertension)    History reviewed. No pertinent surgical history. Family History: History reviewed. No pertinent family history. Family Psychiatric  History: None Social History:  Social History   Substance and Sexual Activity  Alcohol Use No     Social History   Substance and Sexual Activity  Drug Use Not on file    Social History   Socioeconomic History  . Marital status: Single    Spouse name: None  . Number of children: None  . Years of education: None  . Highest education level: None  Social Needs  . Financial resource strain: None  . Food insecurity - worry: None  . Food insecurity - inability: None  . Transportation needs - medical: None  . Transportation needs - non-medical: None  Occupational History  . None  Tobacco Use  . Smoking status: Never Smoker  . Smokeless tobacco: Never Used  Substance and Sexual Activity  . Alcohol use: No  . Drug use: None  . Sexual activity: None  Other Topics Concern  . None  Social History Narrative  . None   Additional Social History:    Allergies:  No Known Allergies  Labs:  Results for orders placed or performed during the hospital encounter of 04/11/17 (from the past 48 hour(s))  MRSA PCR Screening     Status: None   Collection Time: 04/12/17  2:49 AM  Result Value Ref Range   MRSA by PCR NEGATIVE NEGATIVE    Comment:  The GeneXpert MRSA Assay (FDA approved for NASAL specimens only), is one component of a comprehensive MRSA colonization surveillance program. It is not intended to diagnose MRSA infection nor to guide or monitor treatment for MRSA infections.   Hemoglobin A1c     Status: None   Collection Time: 04/12/17  5:02 AM  Result Value Ref Range   Hgb A1c MFr Bld 5.3 4.8 - 5.6 %    Comment: (NOTE) Pre diabetes:          5.7%-6.4% Diabetes:              >6.4% Glycemic control for   <7.0% adults with diabetes    Mean  Plasma Glucose 105.41 mg/dL    Comment: Performed at [facility], [address] [place], [place] [address]  Lipid panel     Status: Abnormal   Collection Time: 04/12/17  5:02 AM  Result Value Ref Range   Cholesterol 198 0 - 200 mg/dL   Triglycerides 130129 <865<150 mg/dL   HDL 47 >78>40 mg/dL   Total CHOL/HDL Ratio 4.2 RATIO   VLDL 26 0 - 40 mg/dL   LDL Cholesterol 469125 (H) 0 - 99 mg/dL    Comment:        Total Cholesterol/HDL:CHD Risk Coronary Heart Disease Risk Table                     Men   Women  1/2 Average Risk   3.4   3.3  Average Risk       5.0   4.4  2 X Average Risk   9.6   7.1  3 X Average Risk  23.4   11.0        Use the calculated Patient Ratio above and the CHD Risk Table to determine the patient's CHD Risk.        ATP III CLASSIFICATION (LDL):  <100     mg/dL   Optimal  [id number]  mg/dL   Near or Above                    Optimal  130-159  mg/dL   Borderline  [id number]  mg/dL   High  >010>190     mg/dL   Very High   Vitamin U72B12     Status: Abnormal   Collection Time: 04/12/17  5:02 AM  Result Value Ref Range   Vitamin B-12 1,791 (H) 180 - 914 pg/mL    Comment: (NOTE) This assay is not validated for testing neonatal or myeloproliferative syndrome specimens for Vitamin B12 levels. Performed at [facility], [address] [place], [place] [address]   Ammonia     Status: None   Collection Time: 04/13/17  1:58 PM  Result Value Ref Range   Ammonia 9 9 - 35 umol/L    Current Facility-Administered Medications  Medication Dose Route Frequency Provider Last Rate Last Dose  . acetaminophen (TYLENOL) tablet 650 mg  650 mg Oral Q6H PRN Arnaldo Nataliamond, Michael S, MD   650 mg at 04/12/17 2149   Or  . acetaminophen (TYLENOL) suppository 650 mg  650 mg Rectal Q6H PRN Arnaldo Nataliamond, Michael S, MD      . alum & mag hydroxide-simeth (MAALOX/MYLANTA) 200-200-20 MG/5ML suspension 30 mL  30 mL Oral PRN Arnaldo Nataliamond, Michael S, MD      . amLODipine (NORVASC) tablet 10 mg  10 mg Oral Daily  Arnaldo Nataliamond, Michael S, MD   10 mg at 04/13/17  1123  . aspirin EC tablet 81 mg  81 mg Oral Daily Arnaldo Nataliamond, Michael S, MD   81 mg at 04/13/17 1123  . docusate sodium (COLACE) capsule 100 mg  100 mg Oral BID Arnaldo Nataliamond, Michael S, MD   100 mg at 04/13/17 1123  . donepezil (ARICEPT) tablet 10 mg  10 mg Oral QHS Arnaldo Nataliamond, Michael S, MD   10 mg at 04/12/17 2149  . enoxaparin (LOVENOX) injection 40 mg  40 mg Subcutaneous Q24H Arnaldo Nataliamond, Michael S, MD   40 mg at 04/13/17 1124  . guaifenesin (ROBITUSSIN) 100 MG/5ML syrup 200 mg  200 mg Oral Q6H PRN Arnaldo Nataliamond, Michael S, MD      . haloperidol (HALDOL) 2 MG/ML solution 0.5 mg  0.5 mg Oral BID Arnaldo Nataliamond, Michael S, MD   0.5 mg at 04/13/17 1124  . haloperidol (HALDOL) tablet 5 mg  5 mg Oral Q6H PRN Milagros LollSudini, Srikar, MD       Or  . haloperidol lactate (HALDOL) injection 5 mg  5 mg Intramuscular Q6H PRN Milagros LollSudini, Srikar, MD   5 mg at 04/12/17 2005  . loperamide (IMODIUM) capsule 2 mg  2 mg Oral PRN Arnaldo Nataliamond, Michael S, MD      . LORazepam (ATIVAN) tablet 1 mg  1 mg Oral TID Auburn BilberryPatel, Shreyang, MD   1 mg at 04/13/17 1731  . magnesium hydroxide (MILK OF MAGNESIA) suspension 30 mL  30 mL Oral QHS PRN Arnaldo Nataliamond, Michael S, MD      . memantine [facility](NAMENDA) tablet 5 mg  5 mg Oral BID Arnaldo Nataliamond, Michael S, MD   5 mg at 04/13/17 1123  . ondansetron (ZOFRAN) tablet 4 mg  4 mg Oral Q6H PRN Arnaldo Nataliamond, Michael S, MD       Or  . ondansetron [facility](ZOFRAN) injection 4 mg  4 mg Intravenous Q6H PRN Arnaldo Nataliamond, Michael S, MD      . QUEtiapine (SEROQUEL) tablet 100 mg  100 mg Oral QHS Arnaldo Nataliamond, Michael S, MD   100 mg at 04/12/17 2149  . sertraline (ZOLOFT) tablet 50 mg  50 mg Oral Daily Arnaldo Nataliamond, Michael S, MD   50 mg at 04/13/17 1124  . vitamin B-12 (CYANOCOBALAMIN) tablet 1,000 mcg  1,000 mcg Oral Daily Arnaldo Nataliamond, Michael S, MD   1,000 mcg at 04/13/17 1123    Musculoskeletal: Strength & Muscle Tone: decreased Gait & Station: unable to stand Patient leans: N/A  Psychiatric Specialty Exam: Physical Exam  Nursing  note and vitals reviewed. Constitutional: He appears well-developed and well-nourished.  HENT:  Head: Normocephalic and atraumatic.  Eyes: Conjunctivae are normal. Pupils are equal, round, and reactive to light.  Neck: Normal range of motion.  Cardiovascular: Regular rhythm and normal heart sounds.  Respiratory: Effort normal. No respiratory distress.  GI: Soft.  Musculoskeletal: Normal range of motion.  Skin: Skin is warm and dry.  Psychiatric: He is noncommunicative.    Review of Systems  Unable to perform ROS: Patient unresponsive    Blood pressure 105/67, pulse (!) 57, temperature 98.6 F (37 C), temperature source Oral, resp. rate 18, height 5\' 8"  (1.727 m), weight 77.8 kg (171 lb 8 oz), SpO2 97 %.Body mass index is 26.08 kg/m.  General Appearance: Casual  Eye Contact:  Negative  Speech:  Negative  Volume:  Decreased  Mood:  Negative  Affect:  Negative  Thought Process:  NA  Orientation:  Negative  Thought Content:  Negative  Suicidal Thoughts:  No  Homicidal Thoughts:  No  Memory:  Negative  Judgement:  Negative  Insight:  Negative  Psychomotor Activity:  Negative  Concentration:  Concentration: Negative  Recall:  Negative  Fund of Knowledge:  Negative  Language:  Negative  Akathisia:  Negative  Handed:  Right  AIMS (if indicated):     Assets:  Social Support  ADL's:  Impaired  Cognition:  Impaired,  Severe  Sleep:        Treatment Plan Summary: Plan Patient has calm down and seems to return to his baseline mental state.  He is scheduled for likely discharge tomorrow.  He is already being prescribed PRN medicines that are appropriate if agitation should arise.  There is no need for me to change any prescription medicines or to interfere with his outpatient care.  No change to medicine.  Supportive counseling with the parents.  Disposition: No evidence of imminent risk to self or others at present.   Patient does not meet criteria for psychiatric inpatient  admission.  Mordecai Rasmussen, MD 04/13/2017 7:35 PM

## 2017-04-13 NOTE — Progress Notes (Signed)
[facility] [place] [facility]   PATIENT NAME: Miguel Tran    MR#:  [id number]  DATE OF BIRTH:  07/12/1955  SUBJECTIVE:  CHIEF COMPLAINT: Patient was very agitated yesterday and received Geodon Today's little more awake was able to walk with PT  REVIEW OF SYSTEMS:  Review of system unobtainable from chronic dementia  DRUG ALLERGIES:  No Known Allergies  VITALS:  Blood pressure 105/67, pulse (!) 57, temperature 98.6 F (37 C), temperature source Oral, resp. rate 18, height 5\' 8"  (1.727 m), weight 171 lb 8 oz (77.8 kg), SpO2 97 %.  PHYSICAL EXAMINATION:  GENERAL:  61 y.o.-year-old patient lying in the bed with no acute distress.  EYES: Pupils equal, round, reactive to light and accommodation. No scleral icterus. Extraocular muscles intact.  HEENT: Head atraumatic, normocephalic. Oropharynx and nasopharynx clear.  NECK:  Supple, no jugular venous distention. No thyroid enlargement, no tenderness.  LUNGS: Normal breath sounds bilaterally, no wheezing, rales,rhonchi or crepitation. No use of accessory muscles of respiration.  CARDIOVASCULAR: S1, S2 normal. No murmurs, rubs, or gallops.  ABDOMEN: Soft, nontender, nondistended. Bowel sounds present. No organomegaly or mass.  EXTREMITIES: No pedal edema, cyanosis, or clubbing.  NEUROLOGIC: He is sleepy PSYCHIATRIC: The patient is sleepy  sKIN: No obvious rash, lesion, or ulcer.    LABORATORY PANEL:   CBC Recent Labs  Lab 04/11/17 0228  WBC 10.2  HGB 15.7  HCT 45.8  PLT 192   ------------------------------------------------------------------------------------------------------------------  Chemistries  Recent Labs  Lab 04/11/17 0228  NA 138  K 3.7  CL 105  CO2 24  GLUCOSE 115*  BUN 17  CREATININE 1.17  CALCIUM 9.5  AST 19  ALT 16*  ALKPHOS 57  BILITOT 1.6*   ------------------------------------------------------------------------------------------------------------------  Cardiac  Enzymes Recent Labs  Lab 04/11/17 0228  TROPONINI <0.03   ------------------------------------------------------------------------------------------------------------------  RADIOLOGY:  Mr Brain Wo Contrast  Result Date: 04/11/2017 CLINICAL DATA:  61 year old male with altered mental status. Dementia diagnosed in 2015. Hypertensive and new tremor of the left upper extremity. EXAM: MRI HEAD WITHOUT CONTRAST TECHNIQUE: Multiplanar, multiecho pulse sequences of the brain and surrounding structures were obtained without intravenous contrast. COMPARISON:  Head CT 0245 hours today.  Brain MRI 06/09/2015. FINDINGS: Brain: No restricted diffusion to suggest acute infarction. No midline shift, mass effect, evidence of mass lesion, ventriculomegaly, extra-axial collection or acute intracranial hemorrhage. Cervicomedullary junction and pituitary are within normal limits. Cerebral volume appears stable since 2017. Biparietal volume loss. Mesial temporal lobe volume loss. Wallace CullensGray and white matter signal appears stable and is largely normal for age. No chronic cerebral blood products. Negative deep gray matter nuclei, brainstem and cerebellum. Vascular: Major intracranial vascular flow voids are stable. Skull and upper cervical spine: Negative. Visualized bone marrow signal is within normal limits. Sinuses/Orbits: Stable and negative. Other: Visible internal auditory structures appear normal. Mastoid air cells remain clear. Negative scalp and face soft tissues. IMPRESSION: 1.  No acute intracranial abnormality. 2. Stable noncontrast MRI appearance of the brain since 2017. Chronic Biparietal and mesial temporal lobe volume loss. Electronically Signed   By: Odessa FlemingH  Hall M.D.   On: 04/11/2017 17:38   [place] Carotid Bilateral (at [facility] And [facility] Only)  Result Date: 04/11/2017 CLINICAL DATA:  Stroke.  History of hypertension and hyperlipidemia. EXAM: BILATERAL CAROTID DUPLEX ULTRASOUND TECHNIQUE: Wallace CullensGray scale imaging, color Doppler and  duplex ultrasound were performed of bilateral carotid and vertebral arteries in the neck. COMPARISON:  None. FINDINGS: Criteria: Quantification of carotid stenosis is based  on velocity parameters that correlate the residual internal carotid diameter with NASCET-based stenosis levels, using the diameter of the distal internal carotid lumen as the denominator for stenosis measurement. The following velocity measurements were obtained: RIGHT ICA:  67/17 cm/sec CCA:  77/14 cm/sec SYSTOLIC ICA/CCA RATIO:  0.9 DIASTOLIC ICA/CCA RATIO:  1.2 ECA:  73 cm/sec LEFT ICA:  55/18 cm/sec CCA:  82/17 cm/sec SYSTOLIC ICA/CCA RATIO:  0.7 DIASTOLIC ICA/CCA RATIO:  1.1 ECA:  84 cm/sec RIGHT CAROTID ARTERY: There is no grayscale evidence of significant intimal thickening or atherosclerotic plaque affecting interrogated portions of the right carotid system. There are no elevated peak systolic velocities within the interrogated course the right internal carotid artery to suggest a hemodynamically significant stenosis. RIGHT VERTEBRAL ARTERY:  Antegrade Flow LEFT CAROTID ARTERY: There is a minimal amount of intimal thickening/atherosclerotic plaque within the left carotid bulb (image 52), extending to involve the origin and proximal aspects of the left internal carotid artery (image 62), not resulting in elevated peak systolic velocities within the interrogated course the left internal carotid artery to suggest a hemodynamically significant stenosis. LEFT VERTEBRAL ARTERY:  Antegrade flow IMPRESSION: 1. Minimal amount of left-sided atherosclerotic plaque, not resulting in a hemodynamically significant stenosis. 2. Normal sonographic evaluation of the right carotid system. Electronically Signed   By: Simonne ComeJohn  Watts M.D.   On: 04/11/2017 15:11    EKG:   Orders placed or performed during the hospital encounter of 04/11/17  . ED EKG  . ED EKG    ASSESSMENT AND PLAN:   This is a 61 year old male admitted for CVA.  1.  Acute delirium  on chronic baseline dementia; CVA Suspected due to clinical presentation however ruled out Likely sequelae of early onset Alzheimer's. MRI of the brain is negative Appreciate neurology recommendations Carotid Dopplers with minimal ICA stenosis which is not significant Patient with psychosis yesterday we will have psychiatry see to recommend changes to his medications   2.  Hypertension: Controlled; continue amlodipine  3.  Early onset dementia with intermittent episodes of agitation  the patient is nonverbal at this time.   Continue Aricept, Namenda, sertraline, Seroquel and donepezil  Psych eval for  4.  Hyperlipidemia: Continuation of statin therapy    Discussed with the father  Likely discharge back to [facility] tomorrow  All the records are reviewed and case discussed with Care Management/Social Workerr.  CODE STATUS: fc  TOTAL TIME TAKING CARE OF THIS PATIENT: 35 minutes.   POSSIBLE D/C IN 1-2DAYS, DEPENDING ON CLINICAL CONDITION.  Note: This dictation was prepared with Dragon dictation along with smaller phrase technology. Any transcriptional errors that result from this process are unintentional.   Auburn BilberryPATEL, Avital Dancy M.D on 04/13/2017 at 1:36 PM  Between 7am to 6pm - Pager - [phone] After 6pm go to [email or web address] - password [email or web address] [facility]  [facility]  Office  [phone]  CC: Primary care physician; Marguarite ArbourSparks, Jeffrey D, MD

## 2017-04-13 NOTE — Progress Notes (Addendum)
Per MD note patient will likely D/C back to [facility] tomorrow. Clinical Child psychotherapistocial Worker (CSW) [facility] and made them aware of above. CSW left patient's father Miguel Tran a voicemail. CSW will continue to follow and assist as needed.   Laverne Resident Care Coordinator from [facility] came to assess patient today and stated that he can return to [facility]. Laverne and patient's father requested home health PT. Per Laverne they use [facility]. RN case manager aware of above and will arrange home health PT.   CSW received a call back from patient's father Miguel Tran who is agreeable with patient returning to [facility] tomorrow.   [employer]Bailey Preciosa Bundrick, LCSW [phone]

## 2017-04-14 LAB — CREATININE, SERUM: Creatinine, Ser: 0.97 mg/dL (ref 0.61–1.24)

## 2017-04-14 LAB — CBC
HEMATOCRIT: 42.4 % (ref 40.0–52.0)
HEMOGLOBIN: 14.8 g/dL (ref 13.0–18.0)
MCH: 31.8 pg (ref 26.0–34.0)
MCHC: 35 g/dL (ref 32.0–36.0)
MCV: 90.7 fL (ref 80.0–100.0)
Platelets: 163 10*3/uL (ref 150–440)
RBC: 4.67 MIL/uL (ref 4.40–5.90)
RDW: 13.2 % (ref 11.5–14.5)
WBC: 9.7 10*3/uL (ref 3.8–10.6)

## 2017-04-14 MED ORDER — LORAZEPAM 1 MG PO TABS: 1.0000 mg | ORAL_TABLET | Freq: Three times a day (TID) | ORAL | 0 refills | Status: AC

## 2017-04-14 MED ORDER — ACETAMINOPHEN 325 MG PO TABS: 650.0000 mg | ORAL_TABLET | Freq: Four times a day (QID) | ORAL | Status: AC | PRN

## 2017-04-14 MED ORDER — DOCUSATE SODIUM 100 MG PO CAPS: 100.0000 mg | ORAL_CAPSULE | Freq: Two times a day (BID) | ORAL | 0 refills | Status: AC | PRN

## 2017-04-14 MED ORDER — HALOPERIDOL 5 MG PO TABS: 5.0000 mg | ORAL_TABLET | Freq: Four times a day (QID) | ORAL | 0 refills | Status: AC | PRN

## 2017-04-14 NOTE — Plan of Care (Signed)
  Adequate for Discharge Education: Knowledge of General Education information will improve 04/14/2017 0410 - Adequate for Discharge by Florinda MarkerWelch, Joann Jorge T, RN Coping: Level of anxiety will decrease 04/14/2017 0410 - Adequate for Discharge by Florinda MarkerWelch, Saim Almanza T, RN Safety: Ability to remain free from injury will improve 04/14/2017 0410 - Adequate for Discharge by Florinda MarkerWelch, Elke Holtry T, RN Nutrition: Risk of aspiration will decrease 04/14/2017 0410 - Adequate for Discharge by Florinda MarkerWelch, Khalin Royce T, RN

## 2017-04-14 NOTE — Plan of Care (Signed)
Calm and cooperative with frequent intervals with closing eyes; arouses easily. Pt had difficulty processing to swallow meds; tried to chew them. Med administration improved with applesauce use. Dad and sitter at bedside. Stroke ruled out. Denies co's and states he is comfortable. Dad and other family member plan to take pt to facility in car and have 24 hour supervision until they feel comfortable with pt condition/status.

## 2017-04-14 NOTE — [place] FL2 (Signed)
[place] MEDICAID FL2 LEVEL OF CARE SCREENING TOOL     IDENTIFICATION  Patient Name: Miguel OresSamuel Grizzle Birthdate: 01/24/1956 Sex: male Admission Date (Current Location): 04/11/2017  [facility] [place]  [place]   Facility and Address:  [facility], [address], [place], [place] [address]      Provider Number: [id number]  Attending Physician Name and Address:  Ramonita LabGouru, Michal Strzelecki, MD  Relative Name and Phone Number:       Current Level of Care: Hospital Recommended Level of Care: Assisted Living Facility Prior Approval Number:    Date Approved/Denied:   PASRR Number:    Discharge Plan: Domiciliary (Rest home)    Current Diagnoses: Patient Active Problem List   Diagnosis Date Noted  . Lethargy 04/11/2017  . 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 02/20/2017    Orientation RESPIRATION BLADDER Height & Weight     Self  Normal Incontinent Weight: 177 lb (80.3 kg) Height:  5\' 8"  (172.7 cm)  BEHAVIORAL SYMPTOMS/MOOD NEUROLOGICAL BOWEL NUTRITION STATUS      Continent Diet(NPO to be advanced)  AMBULATORY STATUS COMMUNICATION OF NEEDS Skin   Limited Assist Verbally Normal                       Personal Care Assistance Level of Assistance  Bathing, Feeding, Dressing Bathing Assistance: Limited assistance Feeding assistance: Independent Dressing Assistance: Limited assistance     Functional Limitations Info  Sight, Hearing, Speech Sight Info: Adequate Hearing Info: Adequate Speech Info: Adequate    SPECIAL CARE FACTORS FREQUENCY  PT (By licensed PT)     PT Frequency: (2-3 Home Health)              Contractures      Additional Factors Info  Code Status, Allergies Code Status Info: (Full Code) Allergies Info: (No Known Allergies)           Current Medications (04/14/2017):  This is the current hospital active medication list Current Facility-Administered Medications  Medication  Dose Route Frequency Provider Last Rate Last Dose  . acetaminophen (TYLENOL) tablet 650 mg  650 mg Oral Q6H PRN Arnaldo Nataliamond, Michael S, MD   650 mg at 04/12/17 2149   Or  . acetaminophen (TYLENOL) suppository 650 mg  650 mg Rectal Q6H PRN Arnaldo Nataliamond, Michael S, MD      . alum & mag hydroxide-simeth (MAALOX/MYLANTA) 200-200-20 MG/5ML suspension 30 mL  30 mL Oral PRN Arnaldo Nataliamond, Michael S, MD      . amLODipine (NORVASC) tablet 10 mg  10 mg Oral Daily Arnaldo Nataliamond, Michael S, MD   10 mg at 04/14/17 0944  . aspirin EC tablet 81 mg  81 mg Oral Daily Arnaldo Nataliamond, Michael S, MD   81 mg at 04/14/17 0944  . docusate sodium (COLACE) capsule 100 mg  100 mg Oral BID Arnaldo Nataliamond, Michael S, MD   100 mg at 04/14/17 0945  . donepezil (ARICEPT) tablet 10 mg  10 mg Oral QHS Arnaldo Nataliamond, Michael S, MD   10 mg at 04/13/17 2119  . enoxaparin (LOVENOX) injection 40 mg  40 mg Subcutaneous Q24H Arnaldo Nataliamond, Michael S, MD   40 mg at 04/14/17 0955  . guaifenesin (ROBITUSSIN) 100 MG/5ML syrup 200 mg  200 mg Oral Q6H PRN Arnaldo Nataliamond, Michael S, MD      . haloperidol (HALDOL) 2 MG/ML solution 0.5 mg  0.5 mg Oral BID Arnaldo Nataliamond, Michael S, MD   0.5 mg at 04/14/17 0956  . haloperidol (  HALDOL) tablet 5 mg  5 mg Oral Q6H PRN Milagros Loll, MD       Or  . haloperidol lactate (HALDOL) injection 5 mg  5 mg Intramuscular Q6H PRN Milagros Loll, MD   5 mg at 04/12/17 2005  . loperamide (IMODIUM) capsule 2 mg  2 mg Oral PRN Arnaldo Natal, MD      . LORazepam (ATIVAN) tablet 1 mg  1 mg Oral TID [place] Bilberry, MD   1 mg at 04/14/17 0944  . magnesium hydroxide (MILK OF MAGNESIA) suspension 30 mL  30 mL Oral QHS PRN Arnaldo Natal, MD      . memantine [facility]) tablet 5 mg  5 mg Oral BID Arnaldo Natal, MD   5 mg at 04/14/17 0944  . ondansetron (ZOFRAN) tablet 4 mg  4 mg Oral Q6H PRN Arnaldo Natal, MD       Or  . ondansetron [facility]) injection 4 mg  4 mg Intravenous Q6H PRN Arnaldo Natal, MD      . QUEtiapine (SEROQUEL) tablet 100 mg  100 mg Oral  QHS Arnaldo Natal, MD   100 mg at 04/13/17 2119  . sertraline (ZOLOFT) tablet 50 mg  50 mg Oral Daily Arnaldo Natal, MD   50 mg at 04/14/17 0945  . vitamin B-12 (CYANOCOBALAMIN) tablet 1,000 mcg  1,000 mcg Oral Daily Arnaldo Natal, MD   1,000 mcg at 04/14/17 0945     Discharge Medications: Current Discharge Medication List       START taking these medications   Details  acetaminophen (TYLENOL) 325 MG tablet Take 2 tablets (650 mg total) every 6 (six) hours as needed by mouth for mild pain (or Fever >/= 101).    docusate sodium (COLACE) 100 MG capsule Take 1 capsule (100 mg total) 2 (two) times daily as needed by mouth for mild constipation. Qty: 10 capsule, Refills: 0         CONTINUE these medications which have CHANGED   Details  haloperidol (HALDOL) 5 MG tablet Take 1 tablet (5 mg total) every 6 (six) hours as needed by mouth for agitation. Qty: 20 tablet, Refills: 0    LORazepam (ATIVAN) 1 MG tablet Take 1 tablet (1 mg total) 3 (three) times daily by mouth. Qty: 15 tablet, Refills: 0         CONTINUE these medications which have NOT CHANGED   Details  alum & mag hydroxide-simeth (MINTOX) 200-200-20 MG/5ML suspension Take 30 mLs by mouth as needed for indigestion or heartburn.    amLODipine (NORVASC) 10 MG tablet Take 10 mg by mouth daily.    aspirin EC 81 MG tablet Take 81 mg by mouth daily.    donepezil (ARICEPT) 10 MG tablet Take 10 mg by mouth at bedtime.    guaifenesin (ROBITUSSIN) 100 MG/5ML syrup Take 200 mg by mouth every 6 (six) hours as needed for cough.    haloperidol (HALDOL) 2 MG/ML solution Take 0.5 mg 2 (two) times daily by mouth.    loperamide (IMODIUM) 2 MG capsule Take 2 mg by mouth as needed for diarrhea or loose stools.    magnesium hydroxide (MILK OF MAGNESIA) 400 MG/5ML suspension Take 30 mLs by mouth at bedtime as needed for constipation.    memantine (NAMENDA) 5 MG tablet Take 5 mg by mouth 2 (two) times daily.     QUEtiapine (SEROQUEL) 100 MG tablet Take 100 mg by mouth at bedtime.    sertraline (ZOLOFT) 50 MG  tablet Take 50 mg by mouth daily.    vitamin B-12 (CYANOCOBALAMIN) 1000 MCG tablet Take 1,000 mcg by mouth daily     Relevant Imaging Results:  Relevant Lab Results:   Additional Information    Judi CongKaren M White, LCSW

## 2017-04-14 NOTE — Progress Notes (Signed)
Sitter 1: 1 with pt with safety issues met.  Ambulated in hall with sitter and 1 family members. Dad at bedside with pt condition stable and agreeable to discharge. Oral and written AVS instructions given to dad and 2 other family member with stated understanding. Discharge packet given to dad to transport to [facility] with AVS in packet. Dad and family requested to transport themselves in their private vehicle with pt transported in transport chair to vehicle.  Discharge to [facility].

## 2017-04-14 NOTE — Discharge Instructions (Signed)
Follow-up with primary care physician in 5 days Continue home health PT

## 2017-04-14 NOTE — Clinical Social Work Note (Signed)
The patient will discharge today via family transport to [facility]. The CSW will deliver the packet to the chart once the discharge summary is available. CSW will continue to follow pending additional discharge needs.  Argentina PonderKaren Martha Kevion Fatheree, MSW, Theresia MajorsLCSWA [phone]

## 2017-04-14 NOTE — Care Management Note (Signed)
Case Management Note  Patient Details  Name: Janine OresSamuel Holycross MRN: [id number] Date of Birth: 12/30/1955  Subjective/Objective:       Discharged back [facility] with HH=PT and Aide called [facility] [place] [facility].              Action/Plan:   Expected Discharge Date:  04/13/17               Expected Discharge Plan:  Assisted Living / Rest Home  In-House Referral:  Clinical Social Work  Discharge planning Services  CM Consult  Post Acute Care Choice:  Home Health Choice offered to:  Parent  DME Arranged:    DME Agency:     HH Arranged:  PT, Nurse's Aide HH Agency:  [facility]  Status of Service:  Completed, signed off  If discussed at Long Length of Stay Meetings, dates discussed:    Additional Comments:  Gerrica Cygan A, RN 04/14/2017, 11:00 AM

## 2017-04-14 NOTE — Discharge Summary (Signed)
[facility] [place] [facility]   PATIENT NAME: Miguel Tran    MR#:  [id number]  DATE OF BIRTH:  Mar 11, 1956  DATE OF ADMISSION:  04/11/2017 ADMITTING PHYSICIAN: Arnaldo Natal, MD  DATE OF DISCHARGE: 04/14/17  PRIMARY CARE PHYSICIAN: Marguarite Arbour, MD    ADMISSION DIAGNOSIS:  Weakness [R53.1] Stroke (cerebrum) (HCC) [I63.9]  DISCHARGE DIAGNOSIS:  Acute Delerium Chronic Alzheimer's dementia early onset Acute stroke ruled out  SECONDARY DIAGNOSIS:   Past Medical History:  Diagnosis Date  . B12 deficiency   . Early onset Alzheimer's dementia   . HLD (hyperlipidemia)   . HTN (hypertension)     HOSPITAL COURSE:   HPI: The patient with past medical history of early onset dementia and hypertension presents to the emergency department via EMS from his nursing home due to elevated blood pressure.  This alarmed the assisted living staff as the patient had some jerking or fasciculation of his left hand while tilting his head to the right.  He has been less able to feed himself over the last few days per staff report.  This evening he has been essentially nonverbal.  CT of the patient's head does not show acute infarct.  However the patient's rapid change in neurologic status suggest possible stroke which prompted the emergency department staff to call the hospitalist service for admission.  1.  Acute delirium on chronic baseline dementia;CVA Suspected due to clinical presentation however ruled outLikely sequelae of early onset Alzheimer's. MRI of the brain is negative Appreciate neurology recommendations Carotid Dopplers with minimal ICA stenosis which is not significant Patient with psychosis ,seen by  Psychiatry , recommended to continue Aricept and as needed Haldol  2. Hypertension: Controlled; continue amlodipine  3. Early onset dementia with intermittent episodes of agitation  the patient is nonverbal at this time.  Continue Aricept,  Namenda, sertraline, Seroquel and donepezil  Psych eval for  4. Hyperlipidemia: Continuation ofstatin therapy     DISCHARGE CONDITIONS:   fair  CONSULTS OBTAINED:  Treatment Team:  Kym Groom, MD Thana Farr, MD Clapacs, Jackquline Denmark, MD   PROCEDURES none   DRUG ALLERGIES:  No Known Allergies  DISCHARGE MEDICATIONS:   Current Discharge Medication List    START taking these medications   Details  acetaminophen (TYLENOL) 325 MG tablet Take 2 tablets (650 mg total) every 6 (six) hours as needed by mouth for mild pain (or Fever >/= 101).    docusate sodium (COLACE) 100 MG capsule Take 1 capsule (100 mg total) 2 (two) times daily as needed by mouth for mild constipation. Qty: 10 capsule, Refills: 0      CONTINUE these medications which have CHANGED   Details  haloperidol (HALDOL) 5 MG tablet Take 1 tablet (5 mg total) every 6 (six) hours as needed by mouth for agitation. Qty: 20 tablet, Refills: 0    LORazepam (ATIVAN) 1 MG tablet Take 1 tablet (1 mg total) 3 (three) times daily by mouth. Qty: 15 tablet, Refills: 0      CONTINUE these medications which have NOT CHANGED   Details  alum & mag hydroxide-simeth (MINTOX) 200-200-20 MG/5ML suspension Take 30 mLs by mouth as needed for indigestion or heartburn.    amLODipine (NORVASC) 10 MG tablet Take 10 mg by mouth daily.    aspirin EC 81 MG tablet Take 81 mg by mouth daily.    donepezil (ARICEPT) 10 MG tablet Take 10 mg by mouth at bedtime.    guaifenesin (ROBITUSSIN) 100  MG/5ML syrup Take 200 mg by mouth every 6 (six) hours as needed for cough.    haloperidol (HALDOL) 2 MG/ML solution Take 0.5 mg 2 (two) times daily by mouth.    loperamide (IMODIUM) 2 MG capsule Take 2 mg by mouth as needed for diarrhea or loose stools.    magnesium hydroxide (MILK OF MAGNESIA) 400 MG/5ML suspension Take 30 mLs by mouth at bedtime as needed for constipation.    memantine (NAMENDA) 5 MG tablet Take 5 mg by mouth 2 (two)  times daily.    QUEtiapine (SEROQUEL) 100 MG tablet Take 100 mg by mouth at bedtime.    sertraline (ZOLOFT) 50 MG tablet Take 50 mg by mouth daily.    vitamin B-12 (CYANOCOBALAMIN) 1000 MCG tablet Take 1,000 mcg by mouth daily.         DISCHARGE INSTRUCTIONS:   Follow-up with primary care physician in 5 days Continue home health PT  DIET:  Low salt, dysphagia 3 with thin liquids  DISCHARGE CONDITION:  Fair  ACTIVITY:  Activity as tolerated per PT  OXYGEN:  Home Oxygen: No.   Oxygen Delivery: room air  DISCHARGE LOCATION:  Assisted living facility  If you experience worsening of your admission symptoms, develop shortness of breath, life threatening emergency, suicidal or homicidal thoughts you must seek medical attention immediately by calling 911 or calling your MD immediately  if symptoms less severe.  You Must read complete instructions/literature along with all the possible adverse reactions/side effects for all the Medicines you take and that have been prescribed to you. Take any new Medicines after you have completely understood and accpet all the possible adverse reactions/side effects.   Please note  You were cared for by a hospitalist during your hospital stay. If you have any questions about your discharge medications or the care you received while you were in the hospital after you are discharged, you can call the unit and asked to speak with the hospitalist on call if the hospitalist that took care of you is not available. Once you are discharged, your primary care physician will handle any further medical issues. Please note that NO REFILLS for any discharge medications will be authorized once you are discharged, as it is imperative that you return to your primary care physician (or establish a relationship with a primary care physician if you do not have one) for your aftercare needs so that they can reassess your need for medications and monitor your lab  values.     Today  Chief Complaint  Patient presents with  . Hypertension   Patient is resting comfortably.  Answering questions appropriately.  Dad at bedside Physical therapy is recommending home health PT and 24-hour supervision, dad is going to arrange 24-hour supervision  ROS: Unobtainable as the patient has  dementia   VITAL SIGNS:  Blood pressure 109/69, pulse 76, temperature 98.7 F (37.1 C), temperature source Oral, resp. rate 16, height 5\' 8"  (1.727 m), weight 80.3 kg (177 lb), SpO2 95 %.  I/O:    Intake/Output Summary (Last 24 hours) at 04/14/2017 1146 Last data filed at 04/14/2017 1015 Gross per 24 hour  Intake 480 ml  Output -  Net 480 ml    PHYSICAL EXAMINATION:  GENERAL:  61 y.o.-year-old patient lying in the bed with no acute distress.  EYES: Pupils equal, round, reactive to light and accommodation. No scleral icterus. [name].  HEENT: Head atraumatic, normocephalic. Oropharynx and nasopharynx clear.  NECK:  Supple, no jugular venous distention. No  thyroid enlargement, no tenderness.  LUNGS: Normal breath sounds bilaterally, no wheezing, rales,rhonchi or crepitation. No use of accessory muscles of respiration.  CARDIOVASCULAR: S1, S2 normal. No murmurs, rubs, or gallops.  ABDOMEN: Soft, non-tender, non-distended. Bowel sounds present. No organomegaly or mass.  EXTREMITIES: No pedal edema, cyanosis, or clubbing.  NEUROLOGIC: Arousable , disoriented  pSYCHIATRIC: The patient is arousable.  SKIN: No obvious rash, lesion, or ulcer.   DATA REVIEW:   CBC Recent Labs  Lab 04/14/17 0438  WBC 9.7  HGB 14.8  HCT 42.4  PLT 163    Chemistries  Recent Labs  Lab 04/11/17 0228 04/14/17 0438  NA 138  --   K 3.7  --   CL 105  --   CO2 24  --   GLUCOSE 115*  --   BUN 17  --   CREATININE 1.17 0.97  CALCIUM 9.5  --   AST 19  --   ALT 16*  --   ALKPHOS 57  --   BILITOT 1.6*  --     Cardiac Enzymes Recent Labs  Lab 04/11/17 0228  TROPONINI <0.03     Microbiology Results  Results for orders placed or performed during the hospital encounter of 04/11/17  MRSA PCR Screening     Status: None   Collection Time: 04/12/17  2:49 AM  Result Value Ref Range Status   MRSA by PCR NEGATIVE NEGATIVE Final    Comment:        The GeneXpert MRSA Assay (FDA approved for NASAL specimens only), is one component of a comprehensive MRSA colonization surveillance program. It is not intended to diagnose MRSA infection nor to guide or monitor treatment for MRSA infections.     RADIOLOGY:  Ct Head Wo Contrast  Result Date: 04/11/2017 CLINICAL DATA:  Altered level of consciousness EXAM: CT HEAD WITHOUT CONTRAST TECHNIQUE: Contiguous axial images were obtained from the base of the skull through the vertex without intravenous contrast. COMPARISON:  02/20/2017 FINDINGS: Brain: No acute territorial infarction, hemorrhage, or intracranial mass is visualized. Mild to moderate atrophy. Stable ventricle size. Vascular: No hyperdense vessels.  Carotid artery calcification. Skull: Normal. Negative for fracture or focal lesion. Sinuses/Orbits: Mild mucosal thickening in the ethmoid sinuses. No acute orbital abnormality. Other: None IMPRESSION: No CT evidence for acute intracranial abnormality.  Atrophy Electronically Signed   By: Jasmine PangKim  Fujinaga M.D.   On: 04/11/2017 03:02   Mr Brain Wo Contrast  Result Date: 04/11/2017 CLINICAL DATA:  61 year old male with altered mental status. Dementia diagnosed in 2015. Hypertensive and new tremor of the left upper extremity. EXAM: MRI HEAD WITHOUT CONTRAST TECHNIQUE: Multiplanar, multiecho pulse sequences of the brain and surrounding structures were obtained without intravenous contrast. COMPARISON:  Head CT 0245 hours today.  Brain MRI 06/09/2015. FINDINGS: Brain: No restricted diffusion to suggest acute infarction. No midline shift, mass effect, evidence of mass lesion, ventriculomegaly, extra-axial collection or acute  intracranial hemorrhage. Cervicomedullary junction and pituitary are within normal limits. Cerebral volume appears stable since 2017. Biparietal volume loss. Mesial temporal lobe volume loss. Wallace CullensGray and white matter signal appears stable and is largely normal for age. No chronic cerebral blood products. Negative deep gray matter nuclei, brainstem and cerebellum. Vascular: Major intracranial vascular flow voids are stable. Skull and upper cervical spine: Negative. Visualized bone marrow signal is within normal limits. Sinuses/Orbits: Stable and negative. Other: Visible internal auditory structures appear normal. Mastoid air cells remain clear. Negative scalp and face soft tissues. IMPRESSION: 1.  No acute intracranial  abnormality. 2. Stable noncontrast MRI appearance of the brain since 2017. Chronic Biparietal and mesial temporal lobe volume loss. Electronically Signed   By: Odessa Fleming M.D.   On: 04/11/2017 17:38   US Carotid Bilateral (at [facility] Only)  Result Date: 04/11/2017 CLINICAL DATA:  Stroke.  History of hypertension and hyperlipidemia. EXAM: BILATERAL CAROTID DUPLEX ULTRASOUND TECHNIQUE: Wallace Cullens scale imaging, color Doppler and duplex ultrasound were performed of bilateral carotid and vertebral arteries in the neck. COMPARISON:  None. FINDINGS: Criteria: Quantification of carotid stenosis is based on velocity parameters that correlate the residual internal carotid diameter with NASCET-based stenosis levels, using the diameter of the distal internal carotid lumen as the denominator for stenosis measurement. The following velocity measurements were obtained: RIGHT ICA:  67/17 cm/sec CCA:  77/14 cm/sec SYSTOLIC ICA/CCA RATIO:  0.9 DIASTOLIC ICA/CCA RATIO:  1.2 ECA:  73 cm/sec LEFT ICA:  55/18 cm/sec CCA:  82/17 cm/sec SYSTOLIC ICA/CCA RATIO:  0.7 DIASTOLIC ICA/CCA RATIO:  1.1 ECA:  84 cm/sec RIGHT CAROTID ARTERY: There is no grayscale evidence of significant intimal thickening or atherosclerotic plaque  affecting interrogated portions of the right carotid system. There are no elevated peak systolic velocities within the interrogated course the right internal carotid artery to suggest a hemodynamically significant stenosis. RIGHT VERTEBRAL ARTERY:  Antegrade Flow LEFT CAROTID ARTERY: There is a minimal amount of intimal thickening/atherosclerotic plaque within the left carotid bulb (image 52), extending to involve the origin and proximal aspects of the left internal carotid artery (image 62), not resulting in elevated peak systolic velocities within the interrogated course the left internal carotid artery to suggest a hemodynamically significant stenosis. LEFT VERTEBRAL ARTERY:  Antegrade flow IMPRESSION: 1. Minimal amount of left-sided atherosclerotic plaque, not resulting in a hemodynamically significant stenosis. 2. Normal sonographic evaluation of the right carotid system. Electronically Signed   By: Simonne Come M.D.   On: 04/11/2017 15:11    EKG:   Orders placed or performed during the hospital encounter of 04/11/17  . ED EKG  . ED EKG      Management plans discussed with the patient'S dad and he is in agreement.  CODE STATUS:     Code Status Orders  (From admission, onward)        Start     Ordered   04/11/17 0856  Full code  Continuous     04/11/17 0855    Code Status History    Date Active Date Inactive Code Status Order ID Comments User Context   This patient has a current code status but no historical code status.      TOTAL TIME TAKING CARE OF THIS PATIENT: 45  minutes.   Note: This dictation was prepared with Dragon dictation along with smaller phrase technology. Any transcriptional errors that result from this process are unintentional.   @MEC @  on 04/14/2017 at 11:46 AM  Between 7am to 6pm - Pager - [phone]  After 6pm go to [email or web address] - password [email or web address] [facility]  [facility]  Office  [phone]  CC: Primary care physician; Marguarite Arbour, MD

## 2017-04-16 ENCOUNTER — Emergency Department
Admission: EM | Admit: 2017-04-16 | Discharge: 2017-04-16 | Disposition: A | Discharge status: Home / Self Care | Payer: Medicare HMO | Attending: Emergency Medicine | Admitting: Emergency Medicine

## 2017-04-16 ENCOUNTER — Other Ambulatory Visit: Payer: Self-pay

## 2017-04-16 ENCOUNTER — Emergency Department: Payer: Medicare HMO

## 2017-04-16 DIAGNOSIS — I10 Essential (primary) hypertension: Secondary | ICD-10-CM | POA: Diagnosis not present

## 2017-04-16 DIAGNOSIS — Z7982 Long term (current) use of aspirin: Secondary | ICD-10-CM | POA: Insufficient documentation

## 2017-04-16 DIAGNOSIS — G309 Alzheimer's disease, unspecified: Secondary | ICD-10-CM | POA: Insufficient documentation

## 2017-04-16 DIAGNOSIS — W19XXXA Unspecified fall, initial encounter: Secondary | ICD-10-CM

## 2017-04-16 DIAGNOSIS — Y999 Unspecified external cause status: Secondary | ICD-10-CM | POA: Insufficient documentation

## 2017-04-16 DIAGNOSIS — F0280 Dementia in other diseases classified elsewhere without behavioral disturbance: Secondary | ICD-10-CM | POA: Diagnosis not present

## 2017-04-16 DIAGNOSIS — Y939 Activity, unspecified: Secondary | ICD-10-CM | POA: Diagnosis not present

## 2017-04-16 DIAGNOSIS — W010XXA Fall on same level from slipping, tripping and stumbling without subsequent striking against object, initial encounter: Secondary | ICD-10-CM | POA: Insufficient documentation

## 2017-04-16 DIAGNOSIS — Z79899 Other long term (current) drug therapy: Secondary | ICD-10-CM | POA: Diagnosis not present

## 2017-04-16 DIAGNOSIS — S0990XA Unspecified injury of head, initial encounter: Secondary | ICD-10-CM | POA: Diagnosis present

## 2017-04-16 DIAGNOSIS — Y92129 Unspecified place in nursing home as the place of occurrence of the external cause: Secondary | ICD-10-CM | POA: Insufficient documentation

## 2017-04-16 NOTE — ED Notes (Signed)
Family at bedside. 

## 2017-04-16 NOTE — ED Provider Notes (Signed)
[facility] Emergency Department Provider Note  ____________________________________________   I have reviewed the triage vital signs and the nursing notes.   HISTORY  Chief Complaint Fall    HPI Janine OresSamuel Trela is a 61 y.o. male presents today complaining of nothing, he has no pain.  I did call the nursing home list that he stood up and tripped and fell hitting his head.  EMS brought him in.  He is at his baseline.  I discussed with the family who are at bedside and they also state he is at his baseline.  They prefer not to have a big workup and would like to taken back to the nursing home.     Past Medical History:  Diagnosis Date  . B12 deficiency   . Early onset Alzheimer's dementia   . HLD (hyperlipidemia)   . HTN (hypertension)     Patient Active Problem List   Diagnosis Date Noted  . Lethargy 04/11/2017  . 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  Prior to Admission medications   Medication Sig Start Date End Date Taking? Authorizing Provider  acetaminophen (TYLENOL) 325 MG tablet Take 2 tablets (650 mg total) every 6 (six) hours as needed by mouth for mild pain (or Fever >/= 101). 04/14/17   Ramonita LabGouru, Aruna, MD  alum & mag hydroxide-simeth (MINTOX) 200-200-20 MG/5ML suspension Take 30 mLs by mouth as needed for indigestion or heartburn.    [provider]  amLODipine (NORVASC) 10 MG tablet Take 10 mg by mouth daily.    [provider]  aspirin EC 81 MG tablet Take 81 mg by mouth daily.    [provider]  docusate sodium (COLACE) 100 MG capsule Take 1 capsule (100 mg total) 2 (two) times daily as needed by mouth for mild constipation. 04/14/17   Gouru, Deanna ArtisAruna, MD  donepezil (ARICEPT) 10 MG tablet Take 10 mg by mouth at bedtime. 09/02/15   [provider]  guaifenesin (ROBITUSSIN) 100 MG/5ML syrup Take 200 mg by mouth every 6 (six)  hours as needed for cough.    [provider]  haloperidol (HALDOL) 2 MG/ML solution Take 0.5 mg 2 (two) times daily by mouth.    [provider]  haloperidol (HALDOL) 5 MG tablet Take 1 tablet (5 mg total) every 6 (six) hours as needed by mouth for agitation. 04/14/17   Ramonita LabGouru, Aruna, MD  loperamide (IMODIUM) 2 MG capsule Take 2 mg by mouth as needed for diarrhea or loose stools.    [provider]  LORazepam (ATIVAN) 1 MG tablet Take 1 tablet (1 mg total) 3 (three) times daily by mouth. 04/14/17   Gouru, Aruna, MD  magnesium hydroxide (MILK OF MAGNESIA) 400 MG/5ML suspension Take 30 mLs by mouth at bedtime as needed for constipation.    [provider]  memantine (NAMENDA) 5 MG tablet Take 5 mg by mouth 2 (two) times daily.    [provider]  QUEtiapine (SEROQUEL) 100 MG tablet Take 100 mg by mouth at bedtime.    [provider]  sertraline (ZOLOFT) 50 MG tablet Take 50 mg by mouth daily.    [provider]  vitamin B-12 (CYANOCOBALAMIN) 1000 MCG tablet Take 1,000 mcg by mouth daily.    [provider]    Allergies Patient has no known allergies.  No family history on file.  Social History Social History   Tobacco Use  .  Smoking status: Never Smoker  . Smokeless tobacco: Never Used  Substance Use Topics  . Alcohol use: No  . Drug use: Not on file    Review of Systems Constitutional: No fever/chills Eyes: No visual changes. ENT: No sore throat. No stiff neck no neck pain Cardiovascular: Denies chest pain. Respiratory: Denies shortness of breath. Gastrointestinal:   no vomiting.  No diarrhea.  No constipation. Genitourinary: Negative for dysuria. Musculoskeletal: Negative lower extremity swelling Skin: Negative for rash. Neurological: Negative for severe headaches, focal weakness or numbness.   ____________________________________________   PHYSICAL EXAM:  VITAL SIGNS: ED Triage Vitals  Enc Vitals  Group     BP 04/16/17 1138 128/85     Pulse Rate 04/16/17 1138 65     Resp 04/16/17 1138 17     Temp 04/16/17 1138 98.4 F (36.9 C)     Temp Source 04/16/17 1138 Oral     SpO2 04/16/17 1138 97 %     Weight 04/16/17 1138 183 lb 8 oz (83.2 kg)     Height 04/16/17 1138 5\' 8"  (1.727 m)     Head Circumference --      Peak Flow --      Pain Score 04/16/17 1137 0     Pain Loc --      Pain Edu? --      Excl. in GC? --     Constitutional: Alert and oriented to name, pleasantly demented in no acute distress. Well appearing and in no acute distress. Eyes: Conjunctivae are normal Head: Atraumatic HEENT: No congestion/rhinnorhea. Mucous membranes are moist.  Oropharynx non-erythematous Neck:   Nontender with no meningismus, no masses, no stridor Cardiovascular: Normal rate, regular rhythm. Grossly normal heart sounds.  Good peripheral circulation. Respiratory: Normal respiratory effort.  No retractions. Lungs CTAB. Abdominal: Soft and nontender. No distention. No guarding no rebound Back:  There is no focal tenderness or step off.  there is no midline tenderness there are no lesions noted. there is no CVA tenderness Musculoskeletal: No lower extremity tenderness, no upper extremity tenderness. No joint effusions, no DVT signs strong distal pulses no edema Neurologic:  Normal speech and language. No gross focal neurologic deficits are appreciated.  Skin:  Skin is warm, dry and intact. No rash noted. Psychiatric: Mood and affect are normal. Speech and behavior are normal.  ____________________________________________   LABS (all labs ordered are listed, but only abnormal results are displayed)  Labs Reviewed - No data to display  Pertinent labs  results that were available during my care of the patient were reviewed by me and considered in my medical decision making (see chart for details). ____________________________________________  EKG  I personally interpreted any EKGs ordered by me  or triage  ____________________________________________  RADIOLOGY  Pertinent labs & imaging results that were available during my care of the patient were reviewed by me and considered in my medical decision making (see chart for details). If possible, patient and/or family made aware of any abnormal findings. ____________________________________________    PROCEDURES  Procedure(s) performed: None  Procedures  Critical Care performed: None  ____________________________________________   INITIAL IMPRESSION / ASSESSMENT AND PLAN / ED COURSE  Pertinent labs & imaging results that were available during my care of the patient were reviewed by me and considered in my medical decision making (see chart for details). Patient here after what was described as a non-syncopal fall with normal vital signs negative CT scan and a family who would like to take him home.  We will discharge him.  They understand the limitations of the workup as presented here.  Without further workup I cannot definitively say that nothing else is going on however at this time patient has no complaints is no evidence of hip fracture or other injury from the fall, and there is no evidence of syncope, and there is no evidence of other familial concerns.    ____________________________________________   FINAL CLINICAL IMPRESSION(S) / ED DIAGNOSES  Final diagnoses:  None      This chart was dictated using voice recognition software.  Despite best efforts to proofread,  errors can occur which can change meaning.      Jeanmarie Plant, MD 04/16/17 1346

## 2017-04-16 NOTE — ED Triage Notes (Signed)
Pt here via [facility] from [facility] after falling forward out of his wheelchair. EMS reports pt was seen a few days ago in ED and worked up to r/o stroke. EMS reports pt at baseline, hx of dementia.

## 2017-06-05 DEATH — deceased

## 2018-05-24 IMAGING — CT CT HEAD W/O CM
3 series · 16 of 47 positions shown, 19 images · non-contrast
Comparison: 02/20/2017

CLINICAL DATA: Altered level of consciousness

EXAM:
CT HEAD WITHOUT CONTRAST
TECHNIQUE: Contiguous axial images were obtained from the base of the skull
through the vertex without intravenous contrast.

[Series 3: head wo · axial · 0.43mm/px · z∈[-75,+50]mm · 10 of 30 slices shown, 13 images]
[im 3/30  brain]
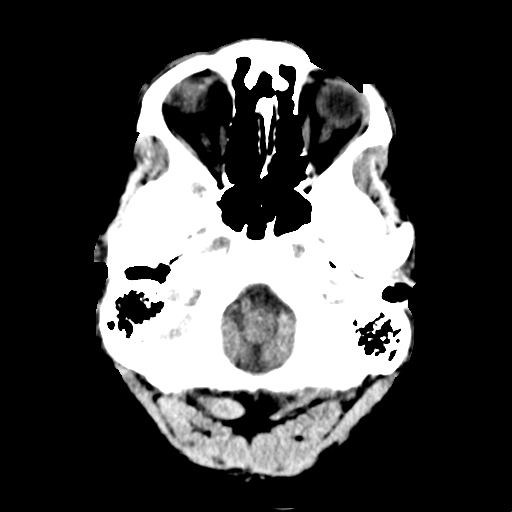
[im 3/30  bone]
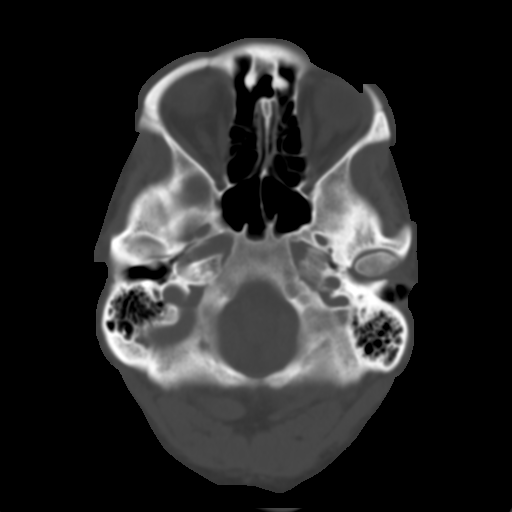
[im 6/30  brain]
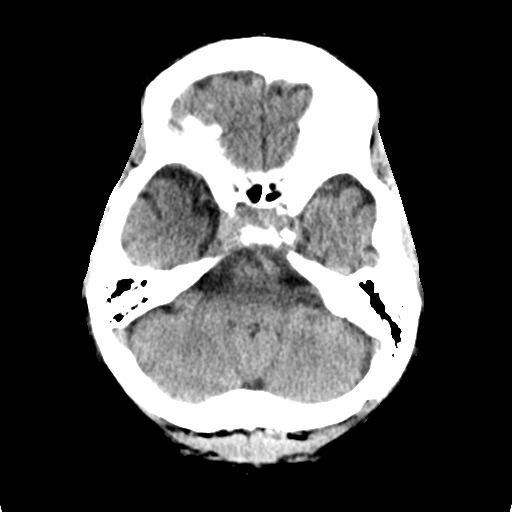
[im 9/30  brain]
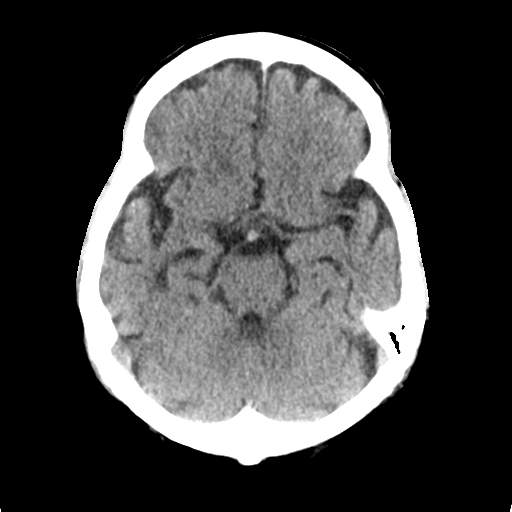
[im 11/30  brain]
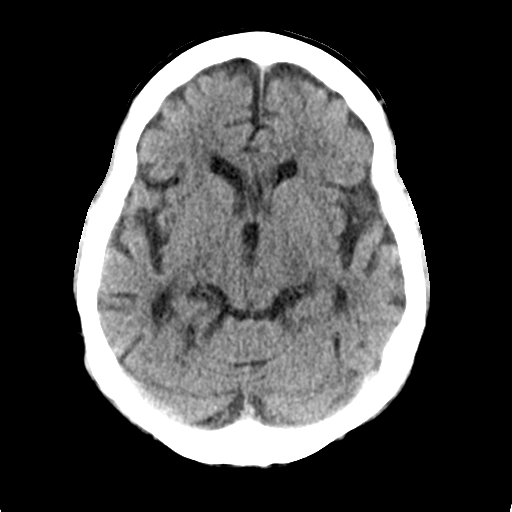
[im 14/30  brain]
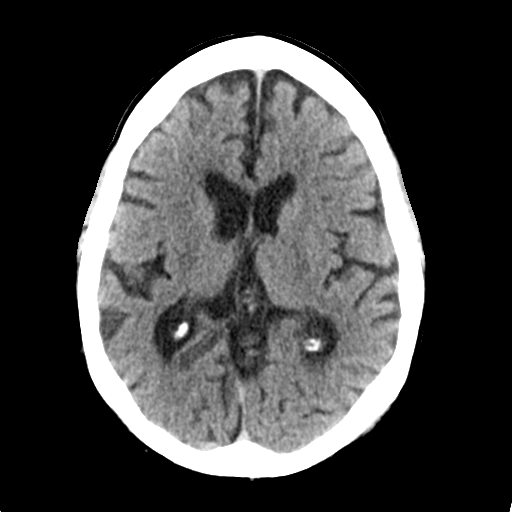
[im 14/30  bone]
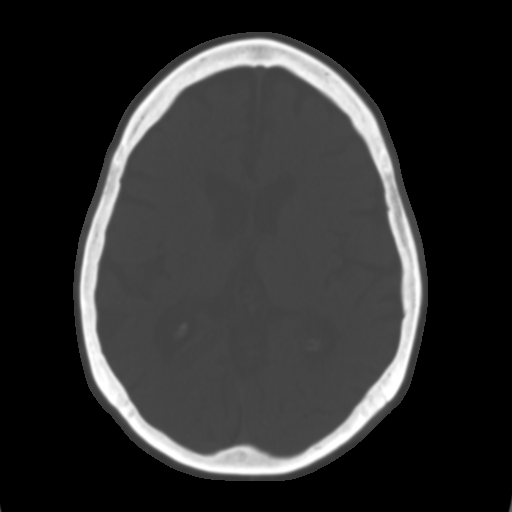
[im 17/30  brain]
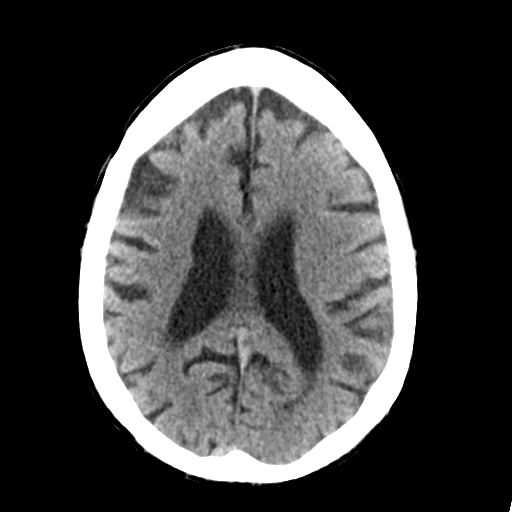
[im 20/30  brain]
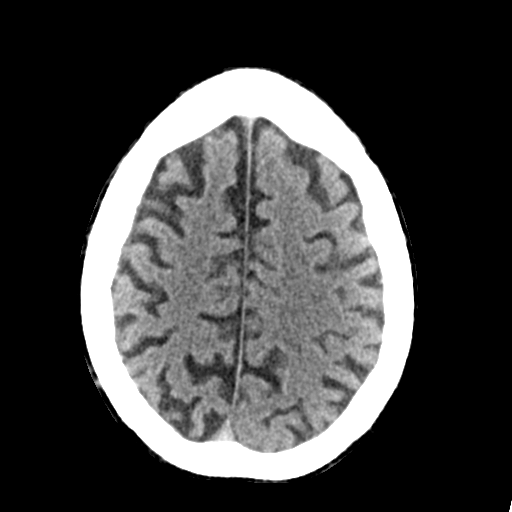
[im 23/30  brain]
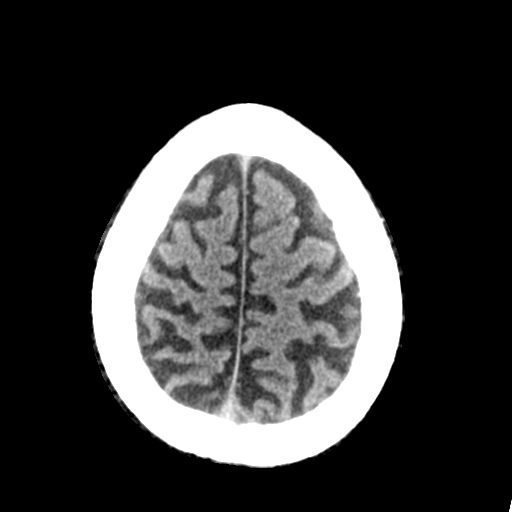
[im 25/30  brain]
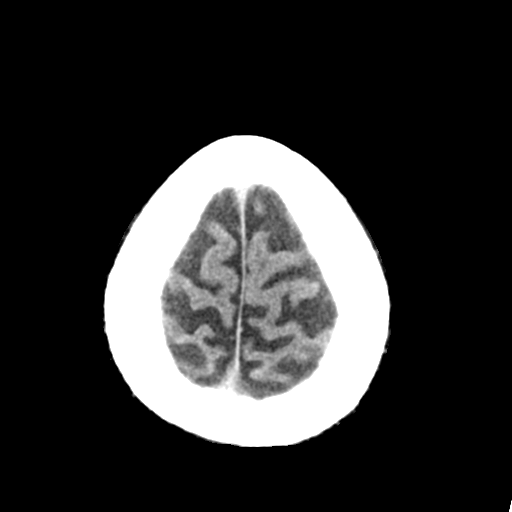
[im 25/30  bone]
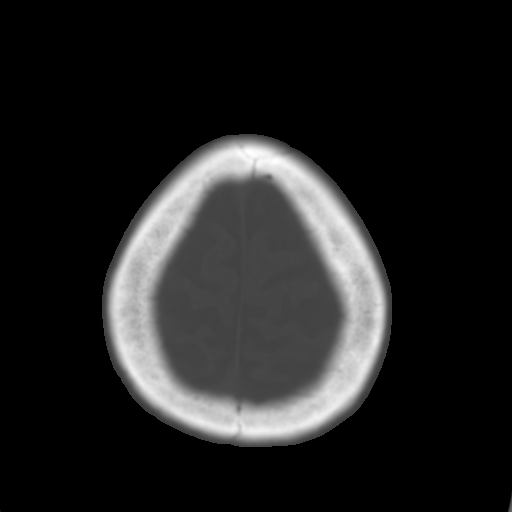
[im 28/30  brain]
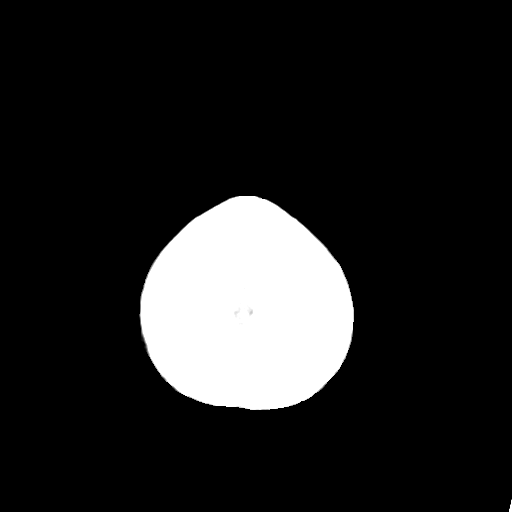

[Series 4: coronal soft tissue · coronal · 0.32mm/px · 3 of 67 slices shown]
[im 23/67  brain]
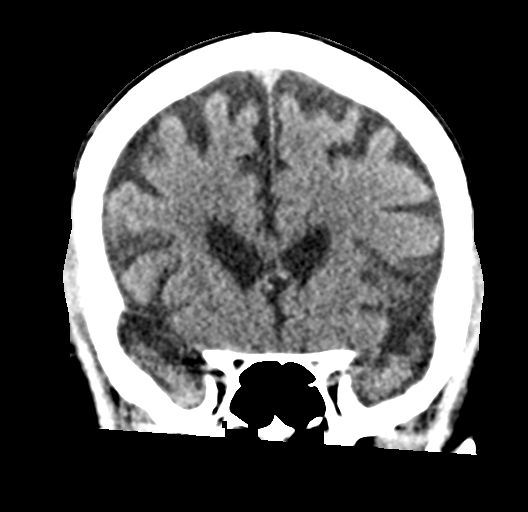
[im 30/67  brain]
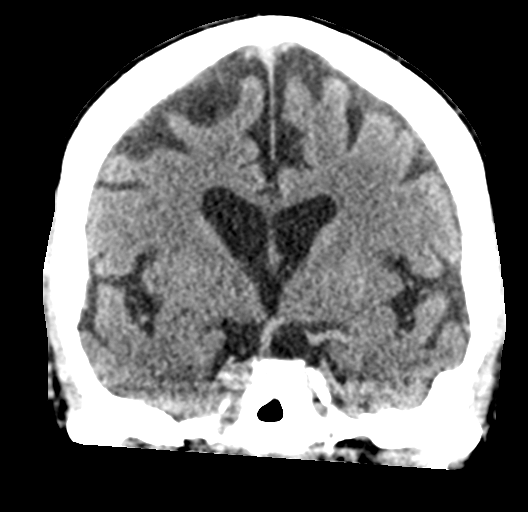
[im 37/67  brain]
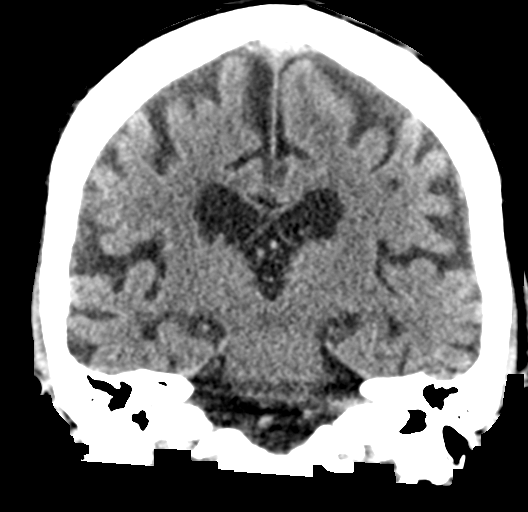

[Series 5: sagittal soft tissue · sagittal · 0.33mm/px · 3 of 52 slices shown]
[im 18/52  brain]
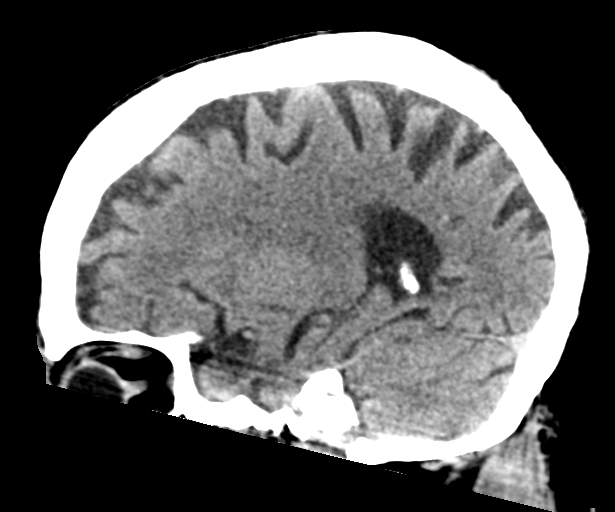
[im 26/52  brain]
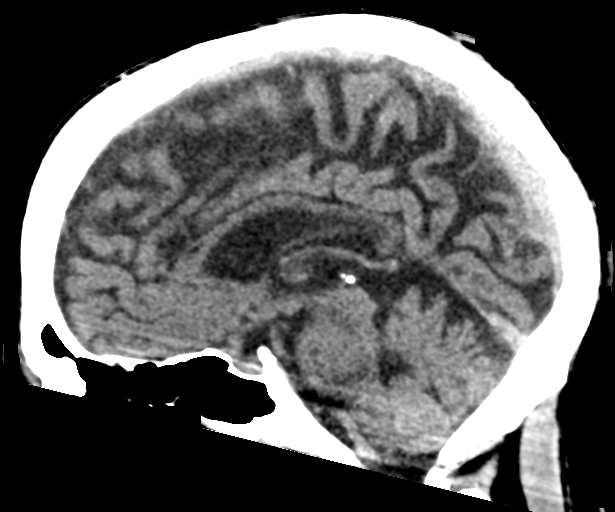
[im 35/52  brain]
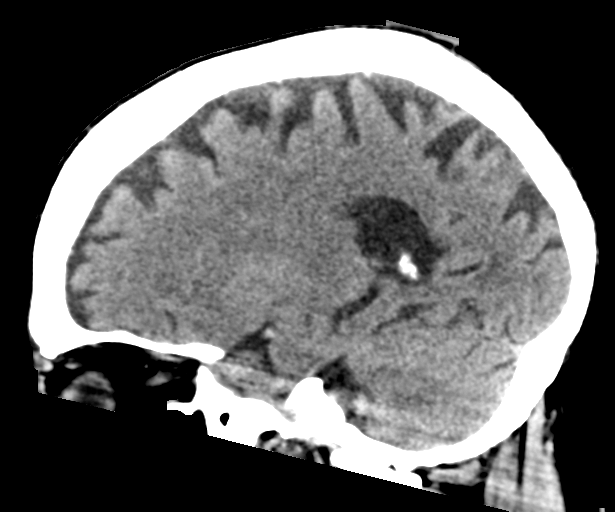

[16 of 47 positions shown; findings below may reference images not displayed]

FINDINGS: Brain: No acute territorial infarction, hemorrhage, or intracranial
mass is visualized. Mild to moderate atrophy. Stable ventricle size.

Vascular: No hyperdense vessels.  Carotid artery calcification.

Skull: Normal. Negative for fracture or focal lesion.

Sinuses/Orbits: Mild mucosal thickening in the ethmoid sinuses. No
acute orbital abnormality.

Other: None
IMPRESSION: No CT evidence for acute intracranial abnormality.  Atrophy
